# Patient Record
Sex: Male | Born: 1950 | Race: White | Hispanic: No | Marital: Married
Health system: Southern US, Community
[De-identification: ages and names within clinical notes are randomized; demographics above are authoritative.]

---

## 2020-08-11 ENCOUNTER — Encounter (HOSPITAL_BASED_OUTPATIENT_CLINIC_OR_DEPARTMENT_OTHER): Payer: Medicare HMO | Attending: Internal Medicine | Admitting: Physician Assistant

## 2020-08-11 ENCOUNTER — Other Ambulatory Visit: Payer: Self-pay

## 2020-08-11 DIAGNOSIS — I890 Lymphedema, not elsewhere classified: Secondary | ICD-10-CM | POA: Insufficient documentation

## 2020-08-11 DIAGNOSIS — L97512 Non-pressure chronic ulcer of other part of right foot with fat layer exposed: Secondary | ICD-10-CM | POA: Insufficient documentation

## 2020-08-11 DIAGNOSIS — E11621 Type 2 diabetes mellitus with foot ulcer: Secondary | ICD-10-CM | POA: Insufficient documentation

## 2020-08-11 DIAGNOSIS — I110 Hypertensive heart disease with heart failure: Secondary | ICD-10-CM | POA: Insufficient documentation

## 2020-08-11 DIAGNOSIS — E1140 Type 2 diabetes mellitus with diabetic neuropathy, unspecified: Secondary | ICD-10-CM | POA: Insufficient documentation

## 2020-08-11 DIAGNOSIS — I5042 Chronic combined systolic (congestive) and diastolic (congestive) heart failure: Secondary | ICD-10-CM | POA: Insufficient documentation

## 2020-08-12 NOTE — Progress Notes (Signed)
Ricky Lloyd, Ricky Lloyd ([id number]) Visit Report for 08/11/2020 Allergy List Details Patient Name: Date of Service: Ricky Lloyd, [name] 08/11/2020 2:45 PM Medical Record Number: [id number] Patient Account Number: [id number] Date of Birth/Sex: Treating RN: 06-05-1950 (69 y.o. Male) Zandra Abts Primary Care Sender Rueb: Cephus Richer Other Clinician: Referring Annajulia Lewing: Treating Nashea Chumney/Extender: Doristine Church Weeks in Treatment: 0 Allergies Active Allergies adhesive tape Reaction: hives hydrochlorothiazide Reaction: hives, arthralgias Statins-Hmg-Coa Reductase Inhibitors Reaction: nose bleeds, nausea/vomiting Allergy Notes Electronic Signature(s) Signed: 08/12/2020 5:54:24 PM By: Zandra Abts RN, BSN Entered By: Zandra Abts on 08/11/2020 15:18:45 -------------------------------------------------------------------------------- Arrival Information Details Patient Name: Date of Service: Ricky Lloyd, Ricky [name] 08/11/2020 2:45 PM Medical Record Number: [id number] Patient Account Number: [id number] Date of Birth/Sex: Treating RN: 08-14-1950 (69 y.o. Male) Zandra Abts Primary Care Fartun Paradiso: Cephus Richer Other Clinician: Referring Elianis Fischbach: Treating Reniyah Gootee/Extender: Earl Many in Treatment: 0 Visit Information Patient Arrived: Ambulatory Arrival Time: 15:13 Accompanied By: alone Transfer Assistance: None Patient Identification Verified: Yes Secondary Verification Process Completed: Yes Patient Requires Transmission-Based Precautions: No Patient Has Alerts: No Electronic Signature(s) Signed: 08/12/2020 5:54:24 PM By: Zandra Abts RN, BSN Entered By: Zandra Abts on 08/11/2020 15:13:41 -------------------------------------------------------------------------------- Clinic Level of Care Assessment Details Patient Name: Date of Service: Ricky Lloyd, Ricky [name] 08/11/2020 2:45 PM Medical Record Number: [id number] Patient  Account Number: [id number] Date of Birth/Sex: Treating RN: Sep 21, 1950 (69 y.o. Male) Zenaida Deed Primary Care Wynn Alldredge: Cephus Richer Other Clinician: Referring Deondray Ospina: Treating Loda Bialas/Extender: Earl Many in Treatment: 0 Clinic Level of Care Assessment Items TOOL 1 Quantity Score []  - 0 Use when EandM and Procedure is performed on INITIAL visit ASSESSMENTS - Nursing Assessment / Reassessment X- 1 20 General Physical Exam (combine w/ comprehensive assessment (listed just below) when performed on new pt. evals) X- 1 25 Comprehensive Assessment (HX, ROS, Risk Assessments, Wounds Hx, etc.) ASSESSMENTS - Wound and Skin Assessment / Reassessment []  - 0 Dermatologic / Skin Assessment (not related to wound area) ASSESSMENTS - Ostomy and/or Continence Assessment and Care []  - 0 Incontinence Assessment and Management []  - 0 Ostomy Care Assessment and Management (repouching, etc.) PROCESS - Coordination of Care X - Simple Patient / Family Education for ongoing care 1 15 []  - 0 Complex (extensive) Patient / Family Education for ongoing care X- 1 10 Staff obtains , Records, T Results / Process Orders est []  - 0 Staff telephones HHA, Nursing Homes / Clarify orders / etc []  - 0 Routine Transfer to another Facility (non-emergent condition) []  - 0 Routine Hospital Admission (non-emergent condition) X- 1 15 New Admissions / / Ordering NPWT Apligraf, etc. , []  - 0 Emergency Hospital Admission (emergent condition) PROCESS - Special Needs []  - 0 Pediatric / Minor Patient Management []  - 0 Isolation Patient Management []  - 0 Hearing / Language / Visual special needs []  - 0 Assessment of Community assistance (transportation, D/C planning, etc.) []  - 0 Additional assistance / Altered mentation []  - 0 Support Surface(s) Assessment (bed, cushion, seat, etc.) INTERVENTIONS - Miscellaneous []  - 0 External ear exam []   - 0 Patient Transfer (multiple staff / / Similar devices) []  - 0 Simple Staple / Suture removal (25 or less) []  - 0 Complex Staple / Suture removal (26 or more) []  - 0 Hypo/Hyperglycemic Management (do not check if billed separately) X- 1 15 Ankle / Brachial Index (ABI) - do not check if billed  separately Has the patient been seen at the hospital within the last three years: Yes Total Score: 100 Level Of Care: New/Established - Level 3 Electronic Signature(s) Signed: 08/12/2020 5:44:19 PM By: Zenaida Deed RN, BSN Signed: 08/12/2020 5:44:19 PM By: Zenaida Deed RN, BSN Entered By: Zenaida Deed on 08/11/2020 16:02:35 -------------------------------------------------------------------------------- Encounter Discharge Information Details Patient Name: Date of Service: Ricky Lloyd, Ricky [name] 08/11/2020 2:45 PM Medical Record Number: [id number] Patient Account Number: [id number] Date of Birth/Sex: Treating RN: Jun 08, 1950 (70 y.o. Male) Shawn Stall Primary Care Elizbeth Posa: Cephus Richer Other Clinician: Referring Kayne Yuhas: Treating Manda Holstad/Extender: Earl Many in Treatment: 0 Encounter Discharge Information Items Post Procedure Vitals Discharge Condition: Stable Temperature (F): 99 Ambulatory Status: Ambulatory Pulse (bpm): 83 Discharge Destination: Home Respiratory Rate (breaths/min): 18 Transportation: Private Auto Blood Pressure (mmHg): 174/96 Accompanied By: self Schedule Follow-up Appointment: Yes Clinical Summary of Care: Electronic Signature(s) Signed: 08/12/2020 5:53:43 PM By: Shawn Stall Entered By: Shawn Stall on 08/11/2020 17:06:10 -------------------------------------------------------------------------------- Lower Extremity Assessment Details Patient Name: Date of Service: Ricky Lloyd, Ricky [name] 08/11/2020 2:45 PM Medical Record Number: [id number] Patient Account Number: [id number] Date of Birth/Sex: Treating  RN: Sep 10, 1950 (69 y.o. Male) Zandra Abts Primary Care Azeneth Carbonell: Cephus Richer Other Clinician: Referring Shevy Yaney: Treating Sajan Cheatwood/Extender: Doristine Church Weeks in Treatment: 0 Edema Assessment Assessed: [Left: No] [Right: No] Edema: [Left: Ye] [Right: s] Calf Left: Right: Point of Measurement: 33 cm From Medial Instep 56.4 cm Ankle Left: Right: Point of Measurement: 16 cm From Medial Instep 32.5 cm Knee To Floor Left: Right: From Medial Instep 48 cm Vascular Assessment Pulses: Dorsalis Pedis Palpable: [Right:Yes] Blood Pressure: Brachial: Ankle: Electronic Signature(s) Signed: 08/12/2020 5:54:24 PM By: Zandra Abts RN, BSN Entered By: Zandra Abts on 08/11/2020 15:31:38 -------------------------------------------------------------------------------- Multi-Disciplinary Care Plan Details Patient Name: Date of Service: Ricky Lloyd, Ricky [name] 08/11/2020 2:45 PM Medical Record Number: [id number] Patient Account Number: [id number] Date of Birth/Sex: Treating RN: 05/18/1951 (69 y.o. Male) Zenaida Deed Primary Care Kenli Waldo: Cephus Richer Other Clinician: Referring Abbe Bula: Treating Carlean Crowl/Extender: Earl Many in Treatment: 0 Multidisciplinary Care Plan reviewed with physician Active Inactive Nutrition Nursing Diagnoses: Impaired glucose control: actual or potential Potential for alteratiion in Nutrition/Potential for imbalanced nutrition Goals: Patient/caregiver will maintain therapeutic glucose control Date Initiated: 08/11/2020 Target Resolution Date: 09/08/2020 Goal Status: Active Interventions: Assess HgA1c results as ordered upon admission and as needed Assess patient nutrition upon admission and as needed per policy Provide education on elevated blood sugars and impact on wound healing Treatment Activities: Patient referred to Primary Care Physician for further nutritional evaluation :  08/11/2020 Notes: Wound/Skin Impairment Nursing Diagnoses: Impaired tissue integrity Knowledge deficit related to ulceration/compromised skin integrity Goals: Patient/caregiver will verbalize understanding of skin care regimen Date Initiated: 08/11/2020 Target Resolution Date: 09/08/2020 Goal Status: Active Ulcer/skin breakdown will have a volume reduction of 30% by week 4 Date Initiated: 08/11/2020 Target Resolution Date: 09/08/2020 Goal Status: Active Interventions: Assess patient/caregiver ability to obtain necessary supplies Assess patient/caregiver ability to perform ulcer/skin care regimen upon admission and as needed Assess ulceration(s) every visit Provide education on ulcer and skin care Treatment Activities: Skin care regimen initiated : 08/11/2020 Topical wound management initiated : 08/11/2020 Notes: Electronic Signature(s) Signed: 08/12/2020 5:44:19 PM By: Zenaida Deed RN, BSN Entered By: Zenaida Deed on 08/11/2020 16:01:21 -------------------------------------------------------------------------------- Pain Assessment Details Patient Name: Date of Service: Ricky Lloyd, Ricky [name] 08/11/2020 2:45 PM Medical Record Number: [id number]  Patient Account Number: [id number] Date of Birth/Sex: Treating RN: 1950/12/23 72(69 y.o. Male) Zandra AbtsLynch, Shatara Primary Care Nielle Duford: Cephus Richerurner, Miranda Other Clinician: Referring Arlesia Kiel: Treating Keisy Strickler/Extender: Doristine ChurchStone III, Hoyt Geigler, Bryan Weeks in Treatment: 0 Active Problems Location of Pain Severity and Description of Pain Patient Has Paino No Site Locations Pain Management and Medication Current Pain Management: Electronic Signature(s) Signed: 08/12/2020 5:54:24 PM By: Zandra AbtsLynch, Shatara RN, BSN Entered By: Zandra AbtsLynch, Shatara on 08/11/2020 15:33:42 -------------------------------------------------------------------------------- Patient/Caregiver Education Details Patient Name: Date of Service: Ricky NNA Arlan OrganA, Ricky [name]  3/16/2022andnbsp2:45 PM Medical Record Number: [id number] Patient Account Number: [id number] Date of Birth/Gender: Treating RN: 1950/12/23 (69 y.o. Male) Zenaida DeedBoehlein, Linda Primary Care Physician: Cephus Richerurner, Miranda Other Clinician: Referring Physician: Treating Physician/Extender: Earl ManyStone III, Hoyt Geigler, Bryan Weeks in Treatment: 0 Education Assessment Education Provided To: Patient Education Topics Provided Elevated Blood Sugar/ Impact on Healing: Handouts: Elevated Blood Sugars: How Do They Affect Wound Healing Methods: Explain/Verbal, Printed Responses: Reinforcements needed, State content correctly Offloading: Handouts: What is Offloadingo Methods: Explain/Verbal, Printed Responses: Reinforcements needed, State content correctly Wound/Skin Impairment: Handouts: Caring for Your Ulcer, Skin Care Do's and Dont's Methods: Explain/Verbal, Printed Responses: Reinforcements needed, State content correctly Electronic Signature(s) Signed: 08/12/2020 5:44:19 PM By: Zenaida DeedBoehlein, Linda RN, BSN Entered By: Zenaida DeedBoehlein, Linda on 08/11/2020 16:02:05 -------------------------------------------------------------------------------- Wound Assessment Details Patient Name: Date of Service: Ricky Lloyd, Ricky [name] 08/11/2020 2:45 PM Medical Record Number: [id number] Patient Account Number: [id number] Date of Birth/Sex: Treating RN: 1950/12/23 43(69 y.o. Male) Zandra AbtsLynch, Shatara Primary Care Dolce Sylvia: Cephus Richerurner, Miranda Other Clinician: Referring Laterrian Hevener: Treating Savannah Morford/Extender: Doristine ChurchStone III, Hoyt Geigler, Bryan Weeks in Treatment: 0 Wound Status Wound Number: 1 Primary Diabetic Wound/Ulcer of the Lower Extremity Etiology: Wound Location: Right, Plantar T Great oe Wound Open Wounding Event: Trauma Status: Date Acquired: 12/27/2017 Comorbid Lymphedema, Sleep Apnea, Arrhythmia, Congestive Heart Failure, Weeks Of Treatment: 0 History: Coronary Artery Disease, Hypertension, Type II Diabetes, Clustered  Wound: No Osteoarthritis, Neuropathy Photos Wound Measurements Length: (cm) 0.5 Width: (cm) 0.5 Depth: (cm) 0.5 Area: (cm) 0.196 Volume: (cm) 0.098 Wound Description Classification: Grade 2 Wound Margin: Thickened Exudate Amount: Medium Exudate Type: Serosanguineous Exudate Color: red, brown Foul Odor After Cleansing: No Slough/Fibrino No % Reduction in Area: 0% % Reduction in Volume: 0% Epithelialization: None Tunneling: No Undermining: Yes Starting Position (o'clock): 12 Ending Position (o'clock): 12 Maximum Distance: (cm) 0.4 Wound Bed Granulation Amount: Large (67-100%) Exposed Structure Granulation Quality: Red, Pink Fascia Exposed: No Necrotic Amount: None Present (0%) Fat Layer (Subcutaneous Tissue) Exposed: Yes Tendon Exposed: No Muscle Exposed: No Joint Exposed: No Bone Exposed: No Treatment Notes Wound #1 (Toe Great) Wound Laterality: Plantar, Right Cleanser Peri-Wound Care Topical Primary Dressing KerraCel Ag Gelling Fiber Dressing, 2x2 in (silver alginate) Discharge Instruction: Apply silver alginate to wound bed as instructed Secondary Dressing Woven Gauze Sponges 2x2 in Discharge Instruction: Apply over primary dressing as directed. Optifoam Non-Adhesive Dressing, 4x4 in Discharge Instruction: Apply over primary dressing cut to form donut to help offload Secured With Conforming Stretch Gauze Bandage, Sterile 2x75 (in/in) Discharge Instruction: Secure with stretch gauze as directed. 33M Medipore H Soft Cloth Surgical T ape, 2x2 (in/yd) Discharge Instruction: Secure dressing with tape as directed. Compression Wrap Compression Stockings Add-Ons Electronic Signature(s) Signed: 08/12/2020 12:58:14 PM By: Karl Itoawkins, Destiny Signed: 08/12/2020 5:54:24 PM By: Zandra AbtsLynch, Shatara RN, BSN Entered By: Karl Itoawkins, Destiny on 08/11/2020 16:38:59 -------------------------------------------------------------------------------- Vitals Details Patient Name: Date of  Service: Ricky Lloyd, Ricky [name] 08/11/2020 2:45 PM Medical Record Number: [id number] Patient  Account Number: [id number] Date of Birth/Sex: Treating RN: 1950/12/20 (70 y.o. Male) Zandra Abts Primary Care Marilynne Dupuis: Cephus Richer Other Clinician: Referring Skarlette Lattner: Treating Tylena Prisk/Extender: Earl Many in Treatment: 0 Vital Signs Time Taken: 15:11 Temperature (F): 99.0 Height (in): 73 Pulse (bpm): 83 Source: Stated Respiratory Rate (breaths/min): 18 Source: Stated Respiratory Rate (breaths/min): 18 Weight (lbs): 390 Blood Pressure (mmHg): 174/96 Source: Stated Capillary Blood Glucose (mg/dl): 284 Body Mass Index (BMI): 51.4 Reference Range: 80 - 120 mg / dl Notes glucose per pt report Electronic Signature(s) Signed: 08/12/2020 5:54:24 PM By: Zandra Abts RN, BSN Entered By: Zandra Abts on 08/11/2020 15:42:04

## 2020-08-12 NOTE — Progress Notes (Signed)
TORRIAN, CANION ([id number]) Visit Report for 08/11/2020 Chief Complaint Document Details Patient Name: Date of Service: [name], [name] 08/11/2020 2:45 PM Medical Record Number: [id number] Patient Account Number: [id number] Date of Birth/Sex: Treating RN: 01/19/1951 (70 y.o. Male) Zenaida Deed Primary Care Provider: Cephus Richer Other Clinician: Referring Provider: Treating Provider/Extender: Earl Many in Treatment: 0 Information Obtained from: Patient Chief Complaint Right 1st T Ulcer oe Electronic Signature(s) Signed: 08/11/2020 3:56:22 PM By: Lenda Kelp PA-C Entered By: Lenda Kelp on 08/11/2020 15:56:22 -------------------------------------------------------------------------------- Debridement Details Patient Name: Date of Service: [name], [name] 08/11/2020 2:45 PM Medical Record Number: [id number] Patient Account Number: [id number] Date of Birth/Sex: Treating RN: [birth year] (70 y.o. Male) Zenaida Deed Primary Care Provider: Cephus Richer Other Clinician: Referring Provider: Treating Provider/Extender: Doristine Church Weeks in Treatment: 0 Debridement Performed for Assessment: Wound #1 Right,Plantar T Great oe Performed By: Physician Lenda Kelp, PA Debridement Type: Debridement Severity of Tissue Pre Debridement: Fat layer exposed Level of Consciousness (Pre-procedure): Awake and Alert Pre-procedure Verification/Time Out Yes - 16:00 Taken: Start Time: 16:00 Pain Control: Lidocaine 4% T opical Solution T Area Debrided (L x W): otal 0.8 (cm) x 0.8 (cm) = 0.64 (cm) Tissue and other material debrided: Viable, Non-Viable, Callus, Slough, Subcutaneous, Skin: Epidermis, Slough Level: Skin/Subcutaneous Tissue Debridement Description: Excisional Instrument: Curette Bleeding: Minimum Hemostasis Achieved: Pressure End Time: 16:07 Procedural Pain: 0 Post Procedural Pain: 0 Response to  Treatment: Procedure was tolerated well Level of Consciousness (Post- Awake and Alert procedure): Post Debridement Measurements of Total Wound Length: (cm) 0.8 Width: (cm) 0.8 Depth: (cm) 0.2 Volume: (cm) 0.101 Character of Wound/Ulcer Post Debridement: Improved Severity of Tissue Post Debridement: Fat layer exposed Post Procedure Diagnosis Same as Pre-procedure Electronic Signature(s) Signed: 08/11/2020 6:22:43 PM By: Lenda Kelp PA-C Signed: 08/12/2020 5:44:19 PM By: Zenaida Deed RN, BSN Entered By: Zenaida Deed on 08/11/2020 16:08:26 -------------------------------------------------------------------------------- HPI Details Patient Name: Date of Service: [name], [name] 08/11/2020 2:45 PM Medical Record Number: [id number] Patient Account Number: [id number] Date of Birth/Sex: Treating RN: Jun 03, 1950 (70 y.o. Male) Zenaida Deed Primary Care Provider: Cephus Richer Other Clinician: Referring Provider: Treating Provider/Extender: Earl Many in Treatment: 0 History of Present Illness HPI Description: 08/11/2020 upon evaluation today patient presents for initial inspection here in our clinic concerning issues he has been having with wounds over the plantar aspect of his great toe on the right which has been present he tells me since 2019. At that time he was cleaning his pool and scraped his toes on the bottom of the pool. 3 of the 4 toes healed without complication this wound however has never closed. He was previously seen at the wound care center in [place]. He tells me that he is used multiple medications included Hydrofera Blue, Aquacel, and unfortunately many other things none of which were ever completely effective. Has not been placed in a total contact cast in fact he did not even know what that was. He does tell me that Unna boots were used at one time as well as other compression wraps to help with leg ulcerations he does have  lymphedema but right now his legs seem to be doing quite well. His ABI is 1.02 today. He does have a history of an A1c 9.06 June 2020. Currently he has been utilizing Aquacel. He tells me he is out of that however. Again he  does have diabetes mellitus type 2, lymphedema, congestive heart failure, and coronary artery disease. Electronic Signature(s) Signed: 08/11/2020 6:03:22 PM By: Lenda Kelp PA-C Entered By: Lenda Kelp on 08/11/2020 18:03:22 -------------------------------------------------------------------------------- Physical Exam Details Patient Name: Date of Service: [name], [name] 08/11/2020 2:45 PM Medical Record Number: [id number] Patient Account Number: [id number] Date of Birth/Sex: Treating RN: 1950/06/19 (70 y.o. Male) Zenaida Deed Primary Care Provider: Cephus Richer Other Clinician: Referring Provider: Treating Provider/Extender: Earl Many in Treatment: 0 Constitutional patient is hypertensive.. pulse regular and within target range for patient.Marland Kitchen respirations regular, non-labored and within target range for patient.Marland Kitchen temperature within target range for patient.. Well-nourished and well-hydrated in no acute distress. Eyes conjunctiva clear no eyelid edema noted. pupils equal round and reactive to light and accommodation. Ears, Nose, Mouth, and Throat no gross abnormality of ear auricles or external auditory canals. normal hearing noted during conversation. mucus membranes moist. Respiratory normal breathing without difficulty. Cardiovascular 2+ dorsalis pedis/posterior tibialis pulses. Patient does have bilateral stage III lymphedema. Musculoskeletal normal gait and posture. no significant deformity or arthritic changes, no loss or range of motion, no clubbing. Psychiatric this patient is able to make decisions and demonstrates good insight into disease process. Alert and Oriented x 3. pleasant and  cooperative. Notes Upon inspection patient's wound bed actually showed signs of fairly good granulation at this time in the base of the wound. With that being said he had a significant amount of unfortunate buildup in regard to callus around the edges of the wound. This is going require sharp debridement to clear away and hopefully get things in a better position as far as healing is concerned. I did perform sharp debridement to clear this away and the patient tolerated all that today without complication. Electronic Signature(s) Signed: 08/11/2020 6:04:37 PM By: Lenda Kelp PA-C Entered By: Lenda Kelp on 08/11/2020 18:04:37 -------------------------------------------------------------------------------- Physician Orders Details Patient Name: Date of Service: [name], [name] 08/11/2020 2:45 PM Medical Record Number: [id number] Patient Account Number: [id number] Date of Birth/Sex: Treating RN: March 03, 1951 (70 y.o. 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 Bathing/ Shower/ Hygiene May shower and wash wound with soap and water. - on days when dressing is cahnged Edema Control - Lymphedema / SCD / Other Bilateral Lower Extremities Elevate legs to the level of the heart or above for 30 minutes daily  and/or when sitting, a frequency of: - whenever sitting Avoid standing for long periods of time. Patient to wear own compression stockings every day. Exercise regularly Moisturize legs daily. Off-Loading Wound #1 Right,Plantar T Great oe Open toe surgical shoe to: - right foot Wound Treatment Wound #1 - T Great oe Wound Laterality: Plantar, Right Prim Dressing: KerraCel Ag Gelling Fiber Dressing, 2x2 in (silver alginate) (DME) (Dispense As Written) Every Other Day/15 Days ary Discharge Instructions: Apply silver alginate to wound bed as instructed Secondary Dressing: Woven Gauze Sponges 2x2 in (  DME) (Generic) Every Other Day/15 Days Discharge Instructions: Apply over primary dressing as directed. Secondary Dressing: Optifoam Non-Adhesive Dressing, 4x4 in (DME) (Generic) Every Other Day/15 Days Discharge Instructions: Apply over primary dressing cut to form donut to help offload Secured With: Conforming Stretch Gauze Bandage, Sterile 2x75 (in/in) (DME) (Generic) Every Other Day/15 Days Discharge Instructions: Secure with stretch gauze as directed. Secured With: 15M Medipore H Soft Cloth Surgical Tape, 2x2 (in/yd) (DME) (Dispense As Written) Every Other Day/15 Days Discharge Instructions: Secure dressing with tape as directed. Radiology X-ray, foot right complete view - diabetic foot ulcer right great toe, r/o osteomyelitis CPT 72630 - (ICD10 L97.512 - Non-pressure chronic ulcer of other part of right foot with fat layer exposed) Electronic Signature(s) Signed: 08/11/2020 6:22:43 PM By: Lenda Kelp PA-C Signed: 08/12/2020 5:44:19 PM By: Zenaida Deed RN, BSN Entered By: Zenaida Deed on 08/11/2020 16:15:39 Prescription 08/11/2020 -------------------------------------------------------------------------------- Particia Jasper PA Patient Name: Provider: [birth year] [id number] Date of Birth: NPI#: Male [id number] Sex: DEA #: [id number] Phone #: License  #: Eligha Bridegroom [facility] Patient Address: [address] [place], [place] [address] [address] [place], [place] [address] [phone] Allergies adhesive tape; hydrochlorothiazide; Statins-Hmg-Coa Reductase Inhibitors Provider's Orders X-ray, foot right complete view - ICD10: L97.512 - diabetic foot ulcer right great toe, r/o osteomyelitis CPT 72630 Hand Signature: Date(s): Electronic Signature(s) Signed: 08/11/2020 6:22:43 PM By: Lenda Kelp PA-C Signed: 08/12/2020 5:44:19 PM By: Zenaida Deed RN, BSN Entered By: Zenaida Deed on 08/11/2020 16:15:39 -------------------------------------------------------------------------------- Problem List Details Patient Name: Date of Service: [name], [name] 08/11/2020 2:45 PM Medical Record Number: [id number] Patient Account Number: [id number] Date of Birth/Sex: Treating RN: 26-May-1951 (70 y.o. Male) Zenaida Deed Primary Care Provider: Cephus Richer Other Clinician: Referring Provider: Treating Provider/Extender: Doristine Church Weeks in Treatment: 0 Active Problems ICD-10 Encounter Code Description Active Date MDM Diagnosis E11.621 Type 2 diabetes mellitus with foot ulcer 08/11/2020 No Yes L97.512 Non-pressure chronic ulcer of other part of right foot with fat layer exposed 08/11/2020 No Yes E11.40 Type 2 diabetes mellitus with diabetic neuropathy, unspecified 08/11/2020 No Yes I89.0 Lymphedema, not elsewhere classified 08/11/2020 No Yes I50.42 Chronic combined systolic (congestive) and diastolic (congestive) heart failure 08/11/2020 No Yes I25.10 Atherosclerotic heart disease of native coronary artery without angina pectoris 08/11/2020 No Yes Inactive Problems Resolved Problems Electronic Signature(s) Signed: 08/11/2020 3:56:07 PM By: Lenda Kelp PA-C Entered By: Lenda Kelp on 08/11/2020  15:56:07 -------------------------------------------------------------------------------- Progress Note Details Patient Name: Date of Service: [name], [name] 08/11/2020 2:45 PM Medical Record Number: [id number] Patient Account Number: [id number] Date of Birth/Sex: Treating RN: 1950/10/10 (70 y.o. Male) Zenaida Deed Primary Care Provider: Cephus Richer Other Clinician: Referring Provider: Treating Provider/Extender: Doristine Church Weeks in Treatment: 0 Subjective Chief Complaint Information obtained from Patient Right 1st T Ulcer oe History of Present Illness (HPI) 08/11/2020 upon evaluation today patient presents for initial inspection here in our clinic concerning issues he has been having with wounds over the plantar aspect of his great toe on the right which has been present he tells me since 2019. At that time he was cleaning his pool and scraped his toes on the bottom of the pool. 3 of the 4 toes healed without complication this wound however has never closed. He was previously seen at the wound care center in [place]. He tells me that he is  used multiple medications included Hydrofera Blue, Aquacel, and unfortunately many other things none of which were ever completely effective. Has not been placed in a total contact cast in fact he did not even know what that was. He does tell me that Unna boots were used at one time as well as other compression wraps to help with leg ulcerations he does have lymphedema but right now his legs seem to be doing quite well. His ABI is 1.02 today. He does have a history of an A1c 9.06 June 2020. Currently he has been utilizing Aquacel. He tells me he is out of that however. Again he does have diabetes mellitus type 2, lymphedema, congestive heart failure, and coronary artery disease. Patient History Information obtained from Patient. Allergies adhesive tape (Reaction: hives), hydrochlorothiazide (Reaction: hives,  arthralgias), Statins-Hmg-Coa Reductase Inhibitors (Reaction: nose bleeds, nausea/vomiting) Family History Unknown History. Social History Never smoker, Marital Status - Married, Alcohol Use - Never, Drug Use - No History, Caffeine Use - Rarely. 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 Blood sugar is tested. Review of Systems (ROS) Constitutional Symptoms (General Health) Denies complaints or symptoms of Fatigue, Fever, Chills, Marked Weight Change. Eyes Complains or has symptoms of Glasses / Contacts - glasses. Ear/Nose/Mouth/Throat Denies complaints or symptoms of Chronic sinus problems or rhinitis. Gastrointestinal Denies complaints or symptoms of Frequent diarrhea, Nausea, Vomiting. Genitourinary Denies complaints or symptoms of Frequent urination. Integumentary (Skin) Complains or has symptoms of Wounds - wound on right great toe. Psychiatric Denies complaints or symptoms of Claustrophobia, Suicidal. Objective Constitutional patient is hypertensive.. pulse regular and within target range for patient.Marland Kitchen respirations regular, non-labored and within target range for patient.Marland Kitchen temperature within target range for patient.. Well-nourished and well-hydrated in no acute distress. Vitals Time Taken: 3:11 PM, Height: 73 in, Source: Stated, Weight: 390 lbs, Source: Stated, BMI: 51.4, Temperature: 99.0 F, Pulse: 83 bpm, Respiratory Rate: 18 breaths/min, Blood Pressure: 174/96 mmHg, Capillary Blood Glucose: 144 mg/dl. General Notes: glucose  per pt report Eyes conjunctiva clear no eyelid edema noted. pupils equal round and reactive to light and accommodation. Ears, Nose, Mouth, and Throat no gross abnormality of ear auricles or external auditory canals. normal hearing noted during conversation. mucus membranes moist. Respiratory normal breathing without difficulty. Cardiovascular 2+ dorsalis pedis/posterior tibialis pulses. Patient does have bilateral stage III lymphedema. Musculoskeletal normal gait and posture. no significant deformity or arthritic changes, no loss or range of motion, no clubbing. Psychiatric this patient is able to make decisions and demonstrates good insight into disease process. Alert and Oriented x 3. pleasant and cooperative. General Notes: Upon inspection patient's wound bed actually showed signs of fairly good granulation at this time in the base of the wound. With that being said he had a significant amount of unfortunate buildup in regard to callus around the edges of the wound. This is going require sharp debridement to clear away and hopefully get things in a better position as far as healing is concerned. I did perform sharp debridement to clear this away and the patient tolerated all that today without complication. Integumentary (Hair, Skin) Wound #1 status is Open. Original cause of wound  was Trauma. The date acquired was: 12/27/2017. The wound is located on the [employer],Plantar T Great. The oe wound measures 0.5cm length x 0.5cm width x 0.5cm depth; 0.196cm^2 area and 0.098cm^3 volume. There is Fat Layer (Subcutaneous Tissue) exposed. There is no tunneling noted, however, there is undermining starting at 12:00 and ending at 12:00 with a maximum distance of 0.4cm. There is a medium amount of serosanguineous drainage noted. The wound margin is thickened. There is large (67-100%) red, pink granulation within the wound bed. There is no necrotic tissue within the wound bed. Assessment Active  Problems ICD-10 Type 2 diabetes mellitus with foot ulcer Non-pressure chronic ulcer of other part of right foot with fat layer exposed Type 2 diabetes mellitus with diabetic neuropathy, unspecified Lymphedema, not elsewhere classified Chronic combined systolic (congestive) and diastolic (congestive) heart failure Atherosclerotic heart disease of native coronary artery without angina pectoris Procedures Wound #1 Pre-procedure diagnosis of Wound #1 is a Diabetic Wound/Ulcer of the Lower Extremity located on the Right,Plantar T Great .Severity of Tissue Pre oe Debridement is: Fat layer exposed. There was a Excisional Skin/Subcutaneous Tissue Debridement with a total area of 0.64 sq cm performed by Lenda KelpStone III, [place], PA. With the following instrument(s): Curette to remove Viable and Non-Viable tissue/material. Material removed includes Callus, Subcutaneous Tissue, Slough, and Skin: Epidermis after achieving pain control using Lidocaine 4% Topical Solution. No specimens were taken. A time out was conducted at 16:00, prior to the start of the procedure. A Minimum amount of bleeding was controlled with Pressure. The procedure was tolerated well with a pain level of 0 throughout and a pain level of 0 following the procedure. Post Debridement Measurements: 0.8cm length x 0.8cm width x 0.2cm depth; 0.101cm^3 volume. Character of Wound/Ulcer Post Debridement is improved. Severity of Tissue Post Debridement is: Fat layer exposed. Post procedure Diagnosis Wound #1: Same as Pre-Procedure Plan Follow-up Appointments: Return Appointment in 1 week. Bathing/ Shower/ Hygiene: May shower and wash wound with soap and water. - on days when dressing is cahnged Edema Control - Lymphedema / SCD / Other: Elevate legs to the level of the heart or above for 30 minutes daily and/or when sitting, a frequency of: - whenever sitting Avoid standing for long periods of time. Patient to wear own compression stockings every  day. Exercise regularly Moisturize legs daily. Off-Loading: Wound #1 Right,Plantar T Great: oe Open toe surgical shoe to: - right foot Radiology ordered were: X-ray, foot right complete view - diabetic foot ulcer right great toe, r/o osteomyelitis CPT [id number] WOUND #1: - T Great Wound Laterality: Plantar, Right oe Prim Dressing: KerraCel Ag Gelling Fiber Dressing, 2x2 in (silver alginate) (DME) (Dispense As Written) Every Other Day/15 Days ary Discharge Instructions: Apply silver alginate to wound bed as instructed Secondary Dressing: Woven Gauze Sponges 2x2 in (DME) (Generic) Every Other Day/15 Days Discharge Instructions: Apply over primary dressing as directed. Secondary Dressing: Optifoam Non-Adhesive Dressing, 4x4 in (DME) (Generic) Every Other Day/15 Days Discharge Instructions: Apply over primary dressing cut to form donut to help offload Secured With: Conforming Stretch Gauze Bandage, Sterile 2x75 (in/in) (DME) (Generic) Every Other Day/15 Days Discharge Instructions: Secure with stretch gauze as directed. Secured With: 18M Medipore H Soft Cloth Surgical T ape, 2x2 (in/yd) (DME) (Dispense As Written) Every Other Day/15 Days Discharge Instructions: Secure dressing with tape as directed. 1. Would recommend currently that we actually go ahead and initiate treatment with a silver alginate dressing which I think is actually a good option for him currently the  patient is in agreement with that plan. 2. I am also going to recommend that we have the patient continue with try and offload is much as possible given an offloading shoe today to try to help in that regard. 3. I am also can recommend currently that we have the patient go ahead and elevate his legs much as possible try to keep edema under good control. 4. I am also going to send in a prescription for an x-ray for him of the toe/foot in order to ensure there is no obvious signs of osteomyelitis. If I do not see anything here then we  may go ahead and proceed with a total contact cast to see if we get things moving in a better direction. I just want to be somewhat reassured that there is no evidence of obvious infection. We will see patient back for reevaluation in 1 week here in the clinic. If anything worsens or changes patient will contact our office for additional recommendations. Electronic Signature(s) Signed: 08/11/2020 6:05:44 PM By: Lenda Kelp PA-C Entered By: Lenda Kelp on 08/11/2020 18:05:44 -------------------------------------------------------------------------------- HxROS Details Patient Name: Date of Service: [name], [name] 08/11/2020 2:45 PM Medical Record Number: [id number] Patient Account Number: [id number] Date of Birth/Sex: Treating RN: March 14, 1951 (70 y.o. Male) Zandra Abts Primary Care Provider: Cephus Richer Other Clinician: Referring Provider: Treating Provider/Extender: Earl Many in Treatment: 0 Information Obtained From Patient Constitutional Symptoms (General Health) Complaints and Symptoms: Negative for: Fatigue; Fever; Chills; Marked Weight Change Eyes Complaints and Symptoms: Positive for: Glasses / Contacts - glasses Ear/Nose/Mouth/Throat Complaints and Symptoms: Negative for: Chronic sinus problems or rhinitis Gastrointestinal Complaints and Symptoms: Negative for: Frequent diarrhea; Nausea; Vomiting Genitourinary Complaints and Symptoms: Negative for: Frequent urination Integumentary (Skin) Complaints and Symptoms: Positive for: Wounds - wound on right great toe Psychiatric Complaints and Symptoms: Negative for: Claustrophobia; Suicidal Hematologic/Lymphatic Medical History: Positive for: Lymphedema - lower legs Respiratory Medical History: Positive for: Sleep Apnea - CPAP Cardiovascular Medical History: Positive for: Arrhythmia - A-fib; Congestive Heart Failure; Coronary Artery Disease - s/p CABG w/5 stents;  Hypertension Endocrine Medical History: Positive for: Type II Diabetes Time with diabetes: over 10 year Treated with: Insulin Blood sugar tested every day: Yes Tested : 2-3 times a day Immunological Musculoskeletal Medical History: Positive for: Osteoarthritis Negative for: Gout Neurologic Medical History: Positive for: Neuropathy Oncologic Immunizations Pneumococcal Vaccine: Received Pneumococcal Vaccination: Yes Implantable Devices None Family and Social History Unknown History: Yes; Never smoker; Marital Status - Married; Alcohol Use: Never; Drug Use: No History; Caffeine Use: Rarely; Financial Concerns: No; Food, Clothing or Shelter Needs: No; Support System Lacking: No; Transportation Concerns: No Electronic Signature(s) Signed: 08/11/2020 6:22:43 PM By: Lenda Kelp PA-C Signed: 08/12/2020 5:54:24 PM By: Zandra Abts RN, BSN Entered By: Zandra Abts on 08/11/2020 15:27:31 -------------------------------------------------------------------------------- SuperBill Details Patient Name: Date of Service: [name], [name] 08/11/2020 Medical Record Number: [id number] Patient Account Number: [id number] Date of Birth/Sex: Treating RN: [birth year] (70 y.o. 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 Facility Procedures CPT4 Code: 16109604 Description: WOUND CARE  VISIT-LEV 3 NEW PT Modifier: 25 Quantity: 1 CPT4 Code: [id number] Description: 11042 - DEB SUBQ TISSUE 20 SQ CM/< ICD-10 Diagnosis Description L97.512 Non-pressure chronic ulcer of other part of right foot with fat layer exposed E11.621 Type 2 diabetes mellitus with foot ulcer Modifier: Quantity: 1 Physician Procedures : CPT4 Code Description Modifier [id number] 99204 - WC PHYS LEVEL 4 - NEW PT 25 ICD-10 Diagnosis Description E11.621 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

## 2020-08-12 NOTE — Progress Notes (Signed)
Ricky Lloyd, Ricky Lloyd ([id number]) Visit Report for 08/11/2020 Abuse/Suicide Risk Screen Details Patient Name: Date of Service: [name], [name] 08/11/2020 2:45 PM Medical Record Number: [id number] Patient Account Number: [id number] Date of Birth/Sex: Treating RN: 04-04-1951 (69 y.o. Male) Zandra Abts Primary Care Airika Alkhatib: Cephus Richer Other Clinician: Referring Valory Wetherby: Treating Levy Cedano/Extender: Doristine Church Weeks in Treatment: 0 Abuse/Suicide Risk Screen Items Answer ABUSE RISK SCREEN: Has anyone close to you tried to hurt or harm you recentlyo No Do you feel uncomfortable with anyone in your familyo No Has anyone forced you do things that you didnt want to doo No Electronic Signature(s) Signed: 08/12/2020 5:54:24 PM By: Zandra Abts RN, BSN Entered By: Zandra Abts on 08/11/2020 15:28:03 -------------------------------------------------------------------------------- Activities of Daily Living Details Patient Name: Date of Service: [name], [name] 08/11/2020 2:45 PM Medical Record Number: [id number] Patient Account Number: [id number] Date of Birth/Sex: Treating RN: August 13, 1950 (69 y.o. Male) Zandra Abts Primary Care Anibal Quinby: Cephus Richer Other Clinician: Referring Kirstie Larsen: Treating Daquan Crapps/Extender: Doristine Church Weeks in Treatment: 0 Activities of Daily Living Items Answer Activities of Daily Living (Please select one for each item) Drive Automobile Completely Able T Medications ake Completely Able Use T elephone Completely Able Care for Appearance Completely Able Use T oilet Completely Able Bath / Shower Completely Able Dress Self Completely Able Feed Self Completely Able Walk Completely Able Get In / Out Bed Completely Able Housework Completely Able Prepare Meals Completely Able Handle Money Completely Able Shop for Self Completely Able Electronic Signature(s) Signed: 08/12/2020 5:54:24 PM By: Zandra Abts RN, BSN Entered By: Zandra Abts on 08/11/2020 15:28:18 -------------------------------------------------------------------------------- Education Screening Details Patient Name: Date of Service: [name], [name] 08/11/2020 2:45 PM Medical Record Number: [id number] Patient Account Number: [id number] Date of Birth/Sex: Treating RN: Oct 06, 1950 (69 y.o. Male) Zandra Abts Primary Care Rodrigo Mcgranahan: Cephus Richer Other Clinician: Referring Deward Sebek: Treating Kierrah Kilbride/Extender: Earl Many in Treatment: 0 Primary Learner Assessed: Patient Learning Preferences/Education Level/Primary Language Learning Preference: Explanation, Demonstration, Printed Material Highest Education Level: Grade School Preferred Language: English Cognitive Barrier Language Barrier: No Translator Needed: No Memory Deficit: No Emotional Barrier: No Cultural/Religious Beliefs Affecting Medical Care: No Physical Barrier Impaired Vision: No Impaired Hearing: No Decreased Hand dexterity: No Knowledge/Comprehension Knowledge Level: High Comprehension Level: High Ability to understand written instructions: High Ability to understand verbal instructions: High Motivation Anxiety Level: Calm Cooperation: Cooperative Education Importance: Acknowledges Need Interest in Health Problems: Asks Questions Perception: Coherent Willingness to Engage in Self-Management High Activities: Readiness to Engage in Self-Management High Activities: Electronic Signature(s) Signed: 08/12/2020 5:54:24 PM By: Zandra Abts RN, BSN Entered By: Zandra Abts on 08/11/2020 15:28:49 -------------------------------------------------------------------------------- Fall Risk Assessment Details Patient Name: Date of Service: [name], [name] 08/11/2020 2:45 PM Medical Record Number: [id number] Patient Account Number: [id number] Date of Birth/Sex: Treating RN: August 18, 1950 (69 y.o. Male)  Zandra Abts Primary Care Dacy Enrico: Cephus Richer Other Clinician: Referring Robbert Langlinais: Treating Earnest Mcgillis/Extender: Doristine Church Weeks in Treatment: 0 Fall Risk Assessment Items Have you had 2 or more falls in the last 12 monthso 0 No Have you had any fall that resulted in injury in the last 12 monthso 0 No FALLS RISK SCREEN History of falling - immediate or within 3 months 0 No Secondary diagnosis (Do you have 2 or more medical diagnoseso) 15 Yes Ambulatory aid None/bed rest/wheelchair/nurse 0 Yes Crutches/cane/walker 0 No Furniture 0 No Intravenous  therapy Access/Saline/Heparin Lock 0 No Gait/Transferring Normal/ bed rest/ wheelchair 0 Yes Weak (short steps with or without shuffle, stooped but able to lift head while walking, may seek 0 No support from furniture) Impaired (short steps with shuffle, may have difficulty arising from chair, head down, impaired 0 No balance) Mental Status Oriented to own ability 0 Yes Electronic Signature(s) Signed: 08/12/2020 5:54:24 PM By: Zandra Abts RN, BSN Entered By: Zandra Abts on 08/11/2020 15:29:25 -------------------------------------------------------------------------------- Foot Assessment Details Patient Name: Date of Service: [name], [name] 08/11/2020 2:45 PM Medical Record Number: [id number] Patient Account Number: [id number] Date of Birth/Sex: Treating RN: Jul 15, 1950 (70 y.o. Male) Zandra Abts Primary Care Lilian Fuhs: Cephus Richer Other Clinician: Referring Maisy Newport: Treating Angeles Paolucci/Extender: Doristine Church Weeks in Treatment: 0 Foot Assessment Items Site Locations + = Sensation present, - = Sensation absent, C = Callus, U = Ulcer R = Redness, W = Warmth, M = Maceration, PU = Pre-ulcerative lesion F = Fissure, S = Swelling, D = Dryness Assessment Right: Left: Other Deformity: No No Prior Foot Ulcer: No No Prior Amputation: No No Charcot Joint: No No Ambulatory  Status: Ambulatory Without Help Gait: Steady Electronic Signature(s) Signed: 08/12/2020 5:54:24 PM By: Zandra Abts RN, BSN Entered By: Zandra Abts on 08/11/2020 15:30:40 -------------------------------------------------------------------------------- Nutrition Risk Screening Details Patient Name: Date of Service: [name], [name] 08/11/2020 2:45 PM Medical Record Number: [id number] Patient Account Number: [id number] Date of Birth/Sex: Treating RN: 10-Aug-1950 (69 y.o. Male) Zandra Abts Primary Care Leighann Amadon: Cephus Richer Other Clinician: Referring Amaziah Ghosh: Treating Ottilie Wigglesworth/Extender: Doristine Church Weeks in Treatment: 0 Height (in): 73 Weight (lbs): 390 Body Mass Index (BMI): 51.4 Nutrition Risk Screening Items Score Screening NUTRITION RISK SCREEN: I have an illness or condition that made me change the kind and/or amount of food I eat 2 Yes I eat fewer than two meals per day 0 No I eat few fruits and vegetables, or milk products 0 No I have three or more drinks of beer, liquor or wine almost every day 0 No I have tooth or mouth problems that make it hard for me to eat 0 No I don't always have enough money to buy the food I need 0 No I eat alone most of the time 0 No I take three or more different prescribed or over-the-counter drugs a day 1 Yes Without wanting to, I have lost or gained 10 pounds in the last six months 0 No I am not always physically able to shop, cook and/or feed myself 2 Yes Nutrition Protocols Good Risk Protocol Moderate Risk Protocol 0 Provide education on nutrition High Risk Proctocol Risk Level: Moderate Risk Score: 5 Electronic Signature(s) Signed: 08/12/2020 5:54:24 PM By: Zandra Abts RN, BSN Entered By: Zandra Abts on 08/11/2020 15:29:33

## 2020-08-18 ENCOUNTER — Other Ambulatory Visit: Payer: Self-pay

## 2020-08-18 ENCOUNTER — Encounter (HOSPITAL_BASED_OUTPATIENT_CLINIC_OR_DEPARTMENT_OTHER): Payer: Medicare HMO | Admitting: Physician Assistant

## 2020-08-18 DIAGNOSIS — E11621 Type 2 diabetes mellitus with foot ulcer: Secondary | ICD-10-CM | POA: Diagnosis not present

## 2020-08-18 NOTE — Progress Notes (Addendum)
Ricky Lloyd, BATTERMAN ([id number]) Visit Report for 08/18/2020 Chief Complaint Document Details Patient Name: Date of Service: [name], [name] 08/18/2020 3:15 PM Medical Record Number: [id number] Patient Account Number: [id number] Date of Birth/Sex: Treating RN: 11-01-1950 (70 y.o. Ricky Lloyd Primary Care Provider: Cephus Lloyd Other Clinician: Referring Provider: Treating Provider/Extender: Ricky Lloyd in Treatment: 1 Information Obtained from: Patient Chief Complaint Right 1st T Ulcer oe Electronic Signature(s) Signed: 08/18/2020 3:49:48 PM By: Ricky Kelp PA-C Entered By: Ricky Lloyd on 08/18/2020 15:49:48 -------------------------------------------------------------------------------- Debridement Details Patient Name: Date of Service: [name], [name] 08/18/2020 3:15 PM Medical Record Number: [id number] Patient Account Number: [id number] Date of Birth/Sex: Treating RN: 11-Oct-1950 (70 y.o. Ricky Lloyd Primary Care Provider: Cephus Lloyd Other Clinician: Referring Provider: Treating Provider/Extender: Ricky Lloyd in Treatment: 1 Debridement Performed for Assessment: Wound #1 Right,Plantar T Great oe Performed By: Physician Ricky Kelp, PA Debridement Type: Debridement Severity of Tissue Pre Debridement: Fat layer exposed Level of Consciousness (Pre-procedure): Awake and Alert Pre-procedure Verification/Time Out Yes - 16:30 Taken: Start Time: 16:30 T Area Debrided (L x W): otal 0.8 (cm) x 0.8 (cm) = 0.64 (cm) Tissue and other material debrided: Viable, Non-Viable, Callus, Slough, Subcutaneous, Skin: Epidermis, Slough Level: Skin/Subcutaneous Tissue Debridement Description: Excisional Instrument: Curette Bleeding: Minimum Hemostasis Achieved: Pressure End Time: 16:33 Procedural Pain: 0 Post Procedural Pain: 0 Response to Treatment: Procedure was tolerated well Level of  Consciousness (Post- Awake and Alert procedure): Post Debridement Measurements of Total Wound Length: (cm) 0.3 Width: (cm) 0.4 Depth: (cm) 0.2 Volume: (cm) 0.019 Character of Wound/Ulcer Post Debridement: Improved Severity of Tissue Post Debridement: Fat layer exposed Post Procedure Diagnosis Same as Pre-procedure Electronic Signature(s) Signed: 08/18/2020 5:29:52 PM By: Ricky Kelp PA-C Signed: 08/18/2020 5:47:10 PM By: Ricky Deed RN, BSN Entered By: Ricky Lloyd on 08/18/2020 16:33:57 -------------------------------------------------------------------------------- HPI Details Patient Name: Date of Service: [name], [name] 08/18/2020 3:15 PM Medical Record Number: [id number] Patient Account Number: [id number] Date of Birth/Sex: Treating RN: 10/31/1950 (70 y.o. Ricky Lloyd Primary Care Provider: Cephus Lloyd Other Clinician: Referring Provider: Treating Provider/Extender: Ricky Lloyd in Treatment: 1 History of Present Illness HPI Description: 08/11/2020 upon evaluation today patient presents for initial inspection here in our clinic concerning issues he has been having with wounds over the plantar aspect of his great toe on the right which has been present he tells me since 2019. At that time he was cleaning his pool and scraped his toes on the bottom of the pool. 3 of the 4 toes healed without complication this wound however has never closed. He was previously seen at the wound care center in [place]. He tells me that he is used multiple medications included Hydrofera Blue, Aquacel, and unfortunately many other things none of which were ever completely effective. Has not been placed in a total contact cast in fact he did not even know what that was. He does tell me that Unna boots were used at one time as well as other compression wraps to help with leg ulcerations he does have lymphedema but right now his legs seem to be doing  quite well. His ABI is 1.02 today. He does have a history of an A1c 9.06 June 2020. Currently he has been utilizing Aquacel. He tells me he is out of that however. Again he does have diabetes mellitus type 2, lymphedema,  congestive heart failure, and coronary artery disease. 08/18/2020 upon evaluation today patient appears to be doing well with regard to his toe ulceration. Fortunately there is just a little bit of callus buildup here and I think that we can manage that quite nicely with appropriate dressing changes here. Fortunately there is no signs of active infection at this time. No fevers, chills, nausea, vomiting, or diarrhea. Unfortunately after I saw him last he ended up over the next 24 hours developing an issue with his right lower extremity further up around the knee and into the thigh region as well as the calf where he had a significant cellulitis completely unrelated to the toe. Nonetheless he did require antibiotics he was given Bactrim fortunately that seems to be doing significantly better which is great news he did show me the pictures. He also had an x-ray performed at his pain clinic and I did review that personally today it was actually an image that he brought me which was fairly clear I saw no evidence of obvious bony destruction he did have some obvious signs of arthritis but again nothing that I think is definitive for osteomyelitis at this point all this was discussed with the patient today as well. Electronic Signature(s) Signed: 08/18/2020 5:13:30 PM By: Ricky Kelp PA-C Entered By: Ricky Lloyd on 08/18/2020 17:13:30 -------------------------------------------------------------------------------- Physical Exam Details Patient Name: Date of Service: [name], [name] 08/18/2020 3:15 PM Medical Record Number: [id number] Patient Account Number: [id number] Date of Birth/Sex: Treating RN: June 14, 1950 (70 y.o. Ricky Lloyd Primary Care Provider: Cephus Lloyd Other Clinician: Referring Provider: Treating Provider/Extender: Ricky Lloyd, Ricky Lloyd in Treatment: 1 Constitutional Well-nourished and well-hydrated in no acute distress. Respiratory normal breathing without difficulty. Psychiatric this patient is able to make decisions and demonstrates good insight into disease process. Alert and Oriented x 3. pleasant and cooperative. Notes Upon inspection patient's wound bed actually showed signs of good granulation at this point with minimal slough buildup on the surface of the wound. I did perform sharp debridement however to clear away some of the necrotic debris on the surface of the wound as well as the callus around the edges of the wound he tolerated all that today without complication. Electronic Signature(s) Signed: 08/18/2020 5:17:47 PM By: Ricky Kelp PA-C Entered By: Ricky Lloyd on 08/18/2020 17:17:46 -------------------------------------------------------------------------------- Physician Orders Details Patient Name: Date of Service: [name], [name] 08/18/2020 3:15 PM Medical Record Number: [id number] Patient Account Number: [id number] Date of Birth/Sex: Treating RN: January 04, 1951 (70 y.o. Ricky Lloyd Primary Care Provider: Cephus Lloyd Other Clinician: Referring Provider: Treating Provider/Extender: Ricky Lloyd in Treatment: 1 Verbal / Phone Orders: No Diagnosis Coding ICD-10 Coding Code Description E11.621 Type 2 diabetes mellitus with foot ulcer L97.512 Non-pressure chronic ulcer of other part of right foot with fat layer exposed E11.40 Type 2 diabetes mellitus with diabetic neuropathy, unspecified I89.0 Lymphedema, not elsewhere classified I50.42 Chronic combined systolic (congestive) and diastolic (congestive) heart failure I25.10 Atherosclerotic heart disease of native coronary artery without angina pectoris Follow-up Appointments Return  Appointment in 1 week. Bathing/ Shower/ Hygiene May shower and wash wound with soap and water. - on days when dressing is cahnged Edema Control - Lymphedema / SCD / Other Bilateral Lower Extremities Elevate legs to the level of the heart or above for 30 minutes daily and/or when sitting, a frequency of: - whenever sitting Avoid standing for long periods of  time. Patient to wear own compression stockings every day. Exercise regularly Moisturize legs daily. Off-Loading Wound #1 Right,Plantar T Great oe Open toe surgical shoe to: - right foot Wound Treatment Wound #1 - T Great oe Wound Laterality: Plantar, Right Prim Dressing: KerraCel Ag Gelling Fiber Dressing, 2x2 in (silver alginate) (Dispense As Written) Every Other Day/15 Days ary Discharge Instructions: Apply silver alginate to wound bed as instructed Secondary Dressing: Woven Gauze Sponges 2x2 in (Generic) Every Other Day/15 Days Discharge Instructions: Apply over primary dressing as directed. Secondary Dressing: Optifoam Non-Adhesive Dressing, 4x4 in (Generic) Every Other Day/15 Days Discharge Instructions: Apply over primary dressing cut to form donut to help offload Secured With: Conforming Stretch Gauze Bandage, Sterile 2x75 (in/in) (Generic) Every Other Day/15 Days Discharge Instructions: Secure with stretch gauze as directed. Secured With: 54M Medipore H Soft Cloth Surgical Tape, 2x2 (in/yd) (Dispense As Written) Every Other Day/15 Days Discharge Instructions: Secure dressing with tape as directed. Electronic Signature(s) Signed: 08/18/2020 5:29:52 PM By: Ricky Kelp PA-C Signed: 08/18/2020 5:47:10 PM By: Ricky Deed RN, BSN Entered By: Ricky Lloyd on 08/18/2020 16:35:06 -------------------------------------------------------------------------------- Problem List Details Patient Name: Date of Service: [name], [name] 08/18/2020 3:15 PM Medical Record Number: [id number] Patient Account Number:  [id number] Date of Birth/Sex: Treating RN: 09/24/1950 (70 y.o. Ricky Lloyd Primary Care Provider: Cephus Lloyd Other Clinician: Referring Provider: Treating Provider/Extender: Ricky Lloyd in Treatment: 1 Active Problems ICD-10 Encounter Code Description Active Date MDM Diagnosis E11.621 Type 2 diabetes mellitus with foot ulcer 08/11/2020 No Yes L97.512 Non-pressure chronic ulcer of other part of right foot with fat layer exposed 08/11/2020 No Yes E11.40 Type 2 diabetes mellitus with diabetic neuropathy, unspecified 08/11/2020 No Yes I89.0 Lymphedema, not elsewhere classified 08/11/2020 No Yes I50.42 Chronic combined systolic (congestive) and diastolic (congestive) heart failure 08/11/2020 No Yes I25.10 Atherosclerotic heart disease of native coronary artery without angina pectoris 08/11/2020 No Yes Inactive Problems Resolved Problems Electronic Signature(s) Signed: 08/18/2020 3:49:42 PM By: Ricky Kelp PA-C Entered By: Ricky Lloyd on 08/18/2020 15:49:42 -------------------------------------------------------------------------------- Progress Note Details Patient Name: Date of Service: [name], [name] 08/18/2020 3:15 PM Medical Record Number: [id number] Patient Account Number: [id number] Date of Birth/Sex: Treating RN: 10/19/1950 (71 y.o. Ricky Lloyd Primary Care Provider: Other Clinician: Cephus Lloyd Referring Provider: Treating Provider/Extender: Ricky Lloyd in Treatment: 1 Subjective Chief Complaint Information obtained from Patient Right 1st T Ulcer oe History of Present Illness (HPI) 08/11/2020 upon evaluation today patient presents for initial inspection here in our clinic concerning issues he has been having with wounds over the plantar aspect of his great toe on the right which has been present he tells me since 2019. At that time he was cleaning his pool and scraped his toes on the  bottom of the pool. 3 of the 4 toes healed without complication this wound however has never closed. He was previously seen at the wound care center in [place]. He tells me that he is used multiple medications included Hydrofera Blue, Aquacel, and unfortunately many other things none of which were ever completely effective. Has not been placed in a total contact cast in fact he did not even know what that was. He does tell me that Unna boots were used at one time as well as other compression wraps to help with leg ulcerations he does have lymphedema but right now his legs seem to be doing quite well. His  ABI is 1.02 today. He does have a history of an A1c 9.06 June 2020. Currently he has been utilizing Aquacel. He tells me he is out of that however. Again he does have diabetes mellitus type 2, lymphedema, congestive heart failure, and coronary artery disease. 08/18/2020 upon evaluation today patient appears to be doing well with regard to his toe ulceration. Fortunately there is just a little bit of callus buildup here and I think that we can manage that quite nicely with appropriate dressing changes here. Fortunately there is no signs of active infection at this time. No fevers, chills, nausea, vomiting, or diarrhea. Unfortunately after I saw him last he ended up over the next 24 hours developing an issue with his right lower extremity further up around the knee and into the thigh region as well as the calf where he had a significant cellulitis completely unrelated to the toe. Nonetheless he did require antibiotics he was given Bactrim fortunately that seems to be doing significantly better which is great news he did show me the pictures. He also had an x-ray performed at his pain clinic and I did review that personally today it was actually an image that he brought me which was fairly clear I saw no evidence of obvious bony destruction he did have some obvious signs of arthritis but again nothing  that I think is definitive for osteomyelitis at this point all this was discussed with the patient today as well. Objective Constitutional Well-nourished and well-hydrated in no acute distress. Vitals Time Taken: 3:40 PM, Height: 73 in, Weight: 390 lbs, BMI: 51.4, Temperature: 97.9 F, Pulse: 125 bpm, Respiratory Rate: 22 breaths/min, Blood Pressure: 151/83 mmHg, Capillary Blood Glucose: 94 mg/dl. Respiratory normal breathing without difficulty. Psychiatric this patient is able to make decisions and demonstrates good insight into disease process. Alert and Oriented x 3. pleasant and cooperative. General Notes: Upon inspection patient's wound bed actually showed signs of good granulation at this point with minimal slough buildup on the surface of the wound. I did perform sharp debridement however to clear away some of the necrotic debris on the surface of the wound as well as the callus around the edges of the wound he tolerated all that today without complication. Integumentary (Hair, Skin) Wound #1 status is Open. Original cause of wound was Trauma. The date acquired was: 12/27/2017. The wound has been in treatment 1 Lloyd. The wound is located on the [employer]. The wound measures 0.3cm length x 0.4cm width x 0.2cm depth; 0.094cm^2 area and 0.019cm^3 volume. There is Fat oe Layer (Subcutaneous Tissue) exposed. There is no tunneling or undermining noted. There is a medium amount of serosanguineous drainage noted. The wound margin is thickened. There is large (67-100%) red, pink granulation within the wound bed. There is no necrotic tissue within the wound bed. General Notes: mild callous periwound. Assessment Active Problems ICD-10 Type 2 diabetes mellitus with foot ulcer Non-pressure chronic ulcer of other part of right foot with fat layer exposed Type 2 diabetes mellitus with diabetic neuropathy, unspecified Lymphedema, not elsewhere classified Chronic combined systolic  (congestive) and diastolic (congestive) heart failure Atherosclerotic heart disease of native coronary artery without angina pectoris Procedures Wound #1 Pre-procedure diagnosis of Wound #1 is a Diabetic Wound/Ulcer of the Lower Extremity located on the Right,Plantar T Great .Severity of Tissue Pre oe Debridement is: Fat layer exposed. There was a Excisional Skin/Subcutaneous Tissue Debridement with a total area of 0.64 sq cm performed by Ricky Kelp, PA. With  the following instrument(s): Curette to remove Viable and Non-Viable tissue/material. Material removed includes Callus, Subcutaneous Tissue, Slough, and Skin: Epidermis. No specimens were taken. A time out was conducted at 16:30, prior to the start of the procedure. A Minimum amount of bleeding was controlled with Pressure. The procedure was tolerated well with a pain level of 0 throughout and a pain level of 0 following the procedure. Post Debridement Measurements: 0.3cm length x 0.4cm width x 0.2cm depth; 0.019cm^3 volume. Character of Wound/Ulcer Post Debridement is improved. Severity of Tissue Post Debridement is: Fat layer exposed. Post procedure Diagnosis Wound #1: Same as Pre-Procedure Plan Follow-up Appointments: Return Appointment in 1 week. Bathing/ Shower/ Hygiene: May shower and wash wound with soap and water. - on days when dressing is cahnged Edema Control - Lymphedema / SCD / Other: Elevate legs to the level of the heart or above for 30 minutes daily and/or when sitting, a frequency of: - whenever sitting Avoid standing for long periods of time. Patient to wear own compression stockings every day. Exercise regularly Moisturize legs daily. Off-Loading: Wound #1 Right,Plantar T Great: oe Open toe surgical shoe to: - right foot WOUND #1: - T Great Wound Laterality: Plantar, Right oe Prim Dressing: KerraCel Ag Gelling Fiber Dressing, 2x2 in (silver alginate) (Dispense As Written) Every Other Day/15  Days ary Discharge Instructions: Apply silver alginate to wound bed as instructed Secondary Dressing: Woven Gauze Sponges 2x2 in (Generic) Every Other Day/15 Days Discharge Instructions: Apply over primary dressing as directed. Secondary Dressing: Optifoam Non-Adhesive Dressing, 4x4 in (Generic) Every Other Day/15 Days Discharge Instructions: Apply over primary dressing cut to form donut to help offload Secured With: Conforming Stretch Gauze Bandage, Sterile 2x75 (in/in) (Generic) Every Other Day/15 Days Discharge Instructions: Secure with stretch gauze as directed. Secured With: 16M Medipore H Soft Cloth Surgical T ape, 2x2 (in/yd) (Dispense As Written) Every Other Day/15 Days Discharge Instructions: Secure dressing with tape as directed. 1. Would recommend currently that we going continue with the wound care measures as before and the patient is in agreement with the plan. This includes the use of the silver alginate dressing which I think is still a good option for him at this point. 2. Muscle can recommend that we have the patient continue to offload is much as possible to try to keep pressure off of this area we also want avoid friction if at all possible. 3 I am also can recommend that he continue to is much as possible monitor for any signs of infection his wife performs dressing changes she is doing a good job based on what I see. 4. With regard to the x-ray I do not see any obvious signs of osteomyelitis. If anything changes visually with his wounds then I will make any adjustments as necessary for the necessary intervention. Right now however I do not think we need to proceed with an MRI. We will see patient back for reevaluation in 1 week here in the clinic. If anything worsens or changes patient will contact our office for additional recommendations. Electronic Signature(s) Signed: 08/18/2020 5:18:55 PM By: Ricky Kelp PA-C Entered By: Ricky Lloyd on 08/18/2020  17:18:54 -------------------------------------------------------------------------------- SuperBill Details Patient Name: Date of Service: [name], [name] 08/18/2020 Medical Record Number: [id number] Patient Account Number: [id number] Date of Birth/Sex: Treating RN: 05/12/1951 (70 y.o. Ricky Lloyd Primary Care Provider: Cephus Lloyd Other Clinician: Referring Provider: Treating Provider/Extender: Ricky Lloyd, Ricky Lloyd in Treatment: 1 Diagnosis Coding  ICD-10 Codes Code Description E11.621 Type 2 diabetes mellitus with foot ulcer L97.512 Non-pressure chronic ulcer of other part of right foot with fat layer exposed E11.40 Type 2 diabetes mellitus with diabetic neuropathy, unspecified I89.0 Lymphedema, not elsewhere classified I50.42 Chronic combined systolic (congestive) and diastolic (congestive) heart failure I25.10 Atherosclerotic heart disease of native coronary artery without angina pectoris Facility Procedures CPT4 Code: [id number] Description: 11042 - DEB SUBQ TISSUE 20 SQ CM/< ICD-10 Diagnosis Description L97.512 Non-pressure chronic ulcer of other part of right foot with fat layer exposed Modifier: Quantity: 1 Physician Procedures : CPT4 Code Description Modifier [id number] - WC PHYS LEVEL 4 - EST PT 25 ICD-10 Diagnosis Description E11.621 Type 2 diabetes mellitus with foot ulcer L97.512 Non-pressure chronic ulcer of other part of right foot with fat layer exposed E11.40 Type 2  diabetes mellitus with diabetic neuropathy, unspecified I89.0 Lymphedema, not elsewhere classified Quantity: 1 : [id number] 11042 - WC PHYS SUBQ TISS 20 SQ CM ICD-10 Diagnosis Description L97.512 Non-pressure chronic ulcer of other part of right foot with fat layer exposed Quantity: 1 Electronic Signature(s) Signed: 08/18/2020 5:19:19 PM By: Ricky KelpStone III, Moselle Rister PA-C Entered By: Ricky KelpStone III, Philopater Mucha on 08/18/2020 17:19:18

## 2020-08-19 NOTE — Progress Notes (Signed)
MCIHAEL, Ricky Lloyd ([id number]) Visit Report for 08/18/2020 Arrival Information Details Patient Name: Date of Service: [name], [name] 08/18/2020 3:15 PM Medical Record Number: [id number] Patient Account Number: [id number] Date of Birth/Sex: Treating RN: April 05, 1951 (70 y.o. Ricky Lloyd Primary Care Ricky Lloyd: Ricky Lloyd Other Clinician: Referring Ricky Lloyd: Treating Ricky Lloyd/Extender: Ricky Lloyd in Treatment: 1 Visit Information History Since Last Visit Added or deleted any medications: Yes Patient Arrived: Ambulatory Any new allergies or adverse reactions: No Arrival Time: 15:39 Had a fall or experienced change in No Accompanied By: self activities of daily living that may affect Transfer Assistance: None risk of falls: Patient Identification Verified: Yes Signs or symptoms of abuse/neglect since last visito No Secondary Verification Process Completed: Yes Hospitalized since last visit: No Patient Requires Transmission-Based Precautions: No Implantable device outside of the clinic excluding No Patient Has Alerts: No cellular tissue based products placed in the center since last visit: Has Dressing in Place as Prescribed: Yes Pain Present Now: Yes Notes Per patient went to PCP last Thursday related to edema, redness, and pain to right leg. Per patient PCP gave Bactrim DS x10 days. Per patient PCP noted it to cellulitis verses sepsis. Today right leg redness has decreased per patient. Electronic Signature(s) Signed: 08/18/2020 6:09:10 PM By: Ricky Lloyd Entered By: Ricky Lloyd on 08/18/2020 15:44:15 -------------------------------------------------------------------------------- Encounter Discharge Information Details Patient Name: Date of Service: [name], [name] 08/18/2020 3:15 PM Medical Record Number: [id number] Patient Account Number: [id number] Date of Birth/Sex: Treating RN: [birth year] (70 y.o. Ricky Lloyd,  Ricky Lloyd Primary Care Ricky Lloyd: Ricky Lloyd Other Clinician: Referring Ricky Lloyd: Treating Ricky Lloyd/Extender: Ricky Lloyd in Treatment: 1 Encounter Discharge Information Items Post Procedure Vitals Discharge Condition: Stable Temperature (F): 97.4 Ambulatory Status: Ambulatory Pulse (bpm): 74 Discharge Destination: Home Respiratory Rate (breaths/min): 17 Transportation: Private Auto Blood Pressure (mmHg): 134/74 Accompanied By: self Schedule Follow-up Appointment: Yes Clinical Summary of Care: Patient Declined Electronic Signature(s) Signed: 08/18/2020 5:50:40 PM By: Ricky Mu RN Entered By: Ricky Lloyd on 08/18/2020 17:32:08 -------------------------------------------------------------------------------- Lower Extremity Assessment Details Patient Name: Date of Service: [name], [name] 08/18/2020 3:15 PM Medical Record Number: [id number] Patient Account Number: [id number] Date of Birth/Sex: Treating RN: 1951/02/19 (70 y.o. Ricky Lloyd Primary Care Azani Brogdon: Ricky Lloyd Other Clinician: Referring Ricky Lloyd: Treating Ricky Lloyd/Extender: Ricky Lloyd, Ricky Lloyd in Treatment: 1 Edema Assessment Assessed: [Left: No] [Right: Yes] Edema: [Left: Ye] [Right: s] Calf Left: Right: Point of Measurement: 33 cm From Medial Instep 57.6 cm Ankle Left: Right: Point of Measurement: 16 cm From Medial Instep 23.3 cm Electronic Signature(s) Signed: 08/18/2020 6:09:10 PM By: Ricky Lloyd Entered By: Ricky Lloyd on 08/18/2020 15:46:23 -------------------------------------------------------------------------------- Multi-Disciplinary Care Plan Details Patient Name: Date of Service: [name], [name] 08/18/2020 3:15 PM Medical Record Number: [id number] Patient Account Number: [id number] Date of Birth/Sex: Treating RN: [birth year] (70 y.o. Ricky Lloyd Primary Care Roma Bierlein: Ricky Lloyd Other  Clinician: Referring Ricky Lloyd: Treating Ricky Lloyd/Extender: Ricky Lloyd in Treatment: 1 Multidisciplinary Care Plan reviewed with physician Active Inactive Nutrition Nursing Diagnoses: Impaired glucose control: actual or potential Potential for alteratiion in Nutrition/Potential for imbalanced nutrition Goals: Patient/caregiver will maintain therapeutic glucose control Date Initiated: 08/11/2020 Target Resolution Date: 09/08/2020 Goal Status: Active Interventions: Assess HgA1c results as ordered upon admission and as needed Assess patient nutrition upon admission and as needed per policy Provide education on elevated blood sugars and  impact on wound healing Treatment Activities: Patient referred to Primary Care Physician for further nutritional evaluation : 08/11/2020 Notes: Wound/Skin Impairment Nursing Diagnoses: Impaired tissue integrity Knowledge deficit related to ulceration/compromised skin integrity Goals: Patient/caregiver will verbalize understanding of skin care regimen Date Initiated: 08/11/2020 Target Resolution Date: 09/08/2020 Goal Status: Active Ulcer/skin breakdown will have a volume reduction of 30% by week 4 Date Initiated: 08/11/2020 Target Resolution Date: 09/08/2020 Goal Status: Active Interventions: Assess patient/caregiver ability to obtain necessary supplies Assess patient/caregiver ability to perform ulcer/skin care regimen upon admission and as needed Assess ulceration(s) every visit Provide education on ulcer and skin care Treatment Activities: Skin care regimen initiated : 08/11/2020 Topical wound management initiated : 08/11/2020 Notes: Electronic Signature(s) Signed: 08/18/2020 5:47:10 PM By: Ricky Deed RN, BSN Entered By: Ricky Lloyd on 08/18/2020 16:28:57 -------------------------------------------------------------------------------- Pain Assessment Details Patient Name: Date of Service: [name], HA RO LD  W. 08/18/2020 3:15 PM Medical Record Number: [id number] Patient Account Number: [id number] Date of Birth/Sex: Treating RN: [birth year] (70 y.o. Ricky Lloyd Primary Care Brnadon Eoff: Ricky Lloyd Other Clinician: Referring Djuna Frechette: Treating Sankalp Ferrell/Extender: Ricky Lloyd in Treatment: 1 Active Problems Location of Pain Severity and Description of Pain Patient Has Paino Yes Site Locations Pain Location: Generalized Pain Rate the pain. Current Pain Level: 6 Worst Pain Level: 10 Least Pain Level: 0 Tolerable Pain Level: 8 Pain Management and Medication Current Pain Management: Medication: No Cold Application: No Rest: No Massage: No Activity: No T.E.N.S.: No Heat Application: No Leg drop or elevation: No Is the Current Pain Management Adequate: Adequate How does your wound impact your activities of daily livingo Sleep: No Bathing: No Appetite: No Relationship With Others: No Bladder Continence: No Emotions: No Bowel Continence: No Work: No Toileting: No Drive: No Dressing: No Hobbies: No Electronic Signature(s) Signed: 08/18/2020 6:09:10 PM By: Ricky Lloyd Entered By: Ricky Lloyd on 08/18/2020 15:45:08 -------------------------------------------------------------------------------- Patient/Caregiver Education Details Patient Name: Date of Service: [name], [name] 3/23/2022andnbsp3:15 PM Medical Record Number: [id number] Patient Account Number: [id number] Date of Birth/Gender: Treating RN: 16-Nov-1950 (70 y.o. Ricky Lloyd Primary Care Physician: Ricky Lloyd Other Clinician: Referring Physician: Treating Physician/Extender: Ricky Lloyd in Treatment: 1 Education Assessment Education Provided To: Patient Education Topics Provided Elevated Blood Sugar/ Impact on Healing: Methods: Explain/Verbal Responses: Reinforcements needed, State content correctly Offloading: Methods:  Explain/Verbal Responses: Reinforcements needed, State content correctly Wound/Skin Impairment: Methods: Explain/Verbal Responses: Reinforcements needed, State content correctly Electronic Signature(s) Signed: 08/18/2020 5:47:10 PM By: Ricky Deed RN, BSN Entered By: Ricky Lloyd on 08/18/2020 84:69:62 -------------------------------------------------------------------------------- Wound Assessment Details Patient Name: Date of Service: [name], [name] 08/18/2020 3:15 PM Medical Record Number: [id number] Patient Account Number: [id number] Date of Birth/Sex: Treating RN: Oct 09, 1950 (70 y.o. Ricky Lloyd Primary Care Donita Newland: Ricky Lloyd Other Clinician: Referring Aryn Kops: Treating Devetta Hagenow/Extender: Ricky Lloyd, Ricky Lloyd in Treatment: 1 Wound Status Wound Number: 1 Primary Diabetic Wound/Ulcer of the Lower Extremity Etiology: Wound Location: Right, Plantar T Great oe Wound Open Wounding Event: Trauma Status: Date Acquired: 12/27/2017 Comorbid Lymphedema, Sleep Apnea, Arrhythmia, Congestive Heart Failure, Lloyd Of Treatment: 1 History: Coronary Artery Disease, Hypertension, Type II Diabetes, Clustered Wound: No Osteoarthritis, Neuropathy Photos Wound Measurements Length: (cm) 0.3 Width: (cm) 0.4 Depth: (cm) 0.2 Area: (cm) 0.094 Volume: (cm) 0.019 % Reduction in Area: 52% % Reduction in Volume: 80.6% Epithelialization: Medium (34-66%) Tunneling: No Undermining: No Wound Description Classification: Grade 2 Wound Margin: Thickened Exudate  Amount: Medium Exudate Type: Serosanguineous Exudate Color: red, brown Foul Odor After Cleansing: No Slough/Fibrino No Wound Bed Granulation Amount: Large (67-100%) Exposed Structure Granulation Quality: Red, Pink Fascia Exposed: No Necrotic Amount: None Present (0%) Fat Layer (Subcutaneous Tissue) Exposed: Yes Tendon Exposed: No Muscle Exposed: No Joint Exposed: No Bone Exposed:  No Assessment Notes mild callous periwound. Treatment Notes Wound #1 (Toe Great) Wound Laterality: Plantar, Right Cleanser Peri-Wound Care Topical Primary Dressing KerraCel Ag Gelling Fiber Dressing, 2x2 in (silver alginate) Discharge Instruction: Apply silver alginate to wound bed as instructed Secondary Dressing Woven Gauze Sponges 2x2 in Discharge Instruction: Apply over primary dressing as directed. Optifoam Non-Adhesive Dressing, 4x4 in Discharge Instruction: Apply over primary dressing cut to form donut to help offload Secured With Conforming Stretch Gauze Bandage, Sterile 2x75 (in/in) Discharge Instruction: Secure with stretch gauze as directed. 74M Medipore H Soft Cloth Surgical T ape, 2x2 (in/yd) Discharge Instruction: Secure dressing with tape as directed. Compression Wrap Compression Stockings Add-Ons Electronic Signature(s) Signed: 08/18/2020 6:09:10 PM By: Ricky Lloyd Signed: 08/19/2020 9:47:55 AM By: Karl Ito Entered By: Karl Ito on 08/18/2020 16:16:16 -------------------------------------------------------------------------------- Vitals Details Patient Name: Date of Service: [name], [name] 08/18/2020 3:15 PM Medical Record Number: [id number] Patient Account Number: [id number] Date of Birth/Sex: Treating RN: November 10, 1950 (70 y.o. Ricky Lloyd Primary Care [place] Cantero: Ricky Lloyd Other Clinician: Referring Dreya Buhrman: Treating Donovin Kraemer/Extender: Ricky Lloyd, Ricky Lloyd in Treatment: 1 Vital Signs Time Taken: 15:40 Temperature (F): 97.9 Height (in): 73 Pulse (bpm): 125 Weight (lbs): 390 Respiratory Rate (breaths/min): 22 Body Mass Index (BMI): 51.4 Blood Pressure (mmHg): 151/83 Capillary Blood Glucose (mg/dl): 94 Reference Range: 80 - 120 mg / dl Electronic Signature(s) Signed: 08/18/2020 6:09:10 PM By: Ricky Lloyd Entered By: Ricky Lloyd on 08/18/2020 15:44:35

## 2020-08-25 ENCOUNTER — Encounter (HOSPITAL_BASED_OUTPATIENT_CLINIC_OR_DEPARTMENT_OTHER): Payer: Medicare HMO | Admitting: Physician Assistant

## 2020-08-25 ENCOUNTER — Other Ambulatory Visit: Payer: Self-pay

## 2020-08-25 DIAGNOSIS — E11621 Type 2 diabetes mellitus with foot ulcer: Secondary | ICD-10-CM | POA: Diagnosis not present

## 2020-08-25 NOTE — Progress Notes (Addendum)
Ricky Lloyd, Ricky W. ([id number]) Visit Report for 08/25/2020 Chief Complaint Document Details Patient Name: Date of Service: [name] 08/25/2020 3:45 PM Medical Record Number: [id number] Patient Account Number: [id number] Date of Birth/Sex: Treating RN: 09-Jun-1950 (70 y.o. Ricky Lloyd) Boehlein, Linda Primary Care Provider: Cephus Richerurner, Miranda Other Clinician: Referring Provider: Treating Provider/Extender: Nicanor BakeStone III, Serenidy Waltz Turner, [name] in Treatment: 2 Information Obtained from: Patient Chief Complaint Right 1st T Ulcer oe Electronic Signature(s) Signed: 08/25/2020 4:01:23 PM By: Lenda KelpStone III, Berthel Bagnall PA-C Entered By: Lenda KelpStone III, Ethelyn Cerniglia on 08/25/2020 16:01:23 -------------------------------------------------------------------------------- Debridement Details Patient Name: Date of Service: [name], [name] 08/25/2020 3:45 PM Medical Record Number: [id number] Patient Account Number: [id number] Date of Birth/Sex: Treating RN: 09-Jun-1950 (70 y.o. Ricky Lloyd) Boehlein, Linda Primary Care Provider: Cephus Richerurner, Miranda Other Clinician: Referring Provider: Treating Provider/Extender: Nicanor BakeStone III, Ballard Budney Turner, [name] in Treatment: 2 Debridement Performed for Assessment: Wound #1 Right,Plantar T Great oe Performed By: Physician Lenda KelpStone III, Sanuel Ladnier, PA Debridement Type: Debridement Severity of Tissue Pre Debridement: Fat layer exposed Level of Consciousness (Pre-procedure): Awake and Alert Pre-procedure Verification/Time Out Yes - 17:00 Taken: Start Time: 17:01 Pain Control: Lidocaine 4% T opical Solution T Area Debrided (L x W): otal 0.5 (cm) x 0.5 (cm) = 0.25 (cm) Tissue and other material debrided: Viable, Non-Viable, Callus, Slough, Subcutaneous, Skin: Epidermis, Slough Level: Skin/Subcutaneous Tissue Debridement Description: Excisional Instrument: Curette Bleeding: Minimum Hemostasis Achieved: Pressure End Time: 17:05 Procedural Pain: 0 Post Procedural Pain: 0 Response to Treatment:  Procedure was tolerated well Level of Consciousness (Post- Awake and Alert procedure): Post Debridement Measurements of Total Wound Length: (cm) 0.5 Width: (cm) 0.5 Depth: (cm) 0.3 Volume: (cm) 0.059 Character of Wound/Ulcer Post Debridement: Improved Severity of Tissue Post Debridement: Fat layer exposed Post Procedure Diagnosis Same as Pre-procedure Electronic Signature(s) Signed: 08/25/2020 5:20:30 PM By: Lenda KelpStone III, Pete Schnitzer PA-C Signed: 08/25/2020 5:50:20 PM By: Zenaida DeedBoehlein, Linda RN, BSN Entered By: Zenaida DeedBoehlein, Linda on 08/25/2020 17:05:51 -------------------------------------------------------------------------------- HPI Details Patient Name: Date of Service: [name], [name] 08/25/2020 3:45 PM Medical Record Number: [id number] Patient Account Number: [id number] Date of Birth/Sex: Treating RN: 09-Jun-1950 (70 y.o. Ricky Lloyd) Boehlein, Linda Primary Care Provider: Cephus Richerurner, Miranda Other Clinician: Referring Provider: Treating Provider/Extender: Nicanor BakeStone III, Evangelene Vora Turner, [name] in Treatment: 2 History of Present Illness HPI Description: 08/11/2020 upon evaluation today patient presents for initial inspection here in our clinic concerning issues he has been having with wounds over the plantar aspect of his great toe on the right which has been present he tells me since 2019. At that time he was cleaning his pool and scraped his toes on the bottom of the pool. 3 of the 4 toes healed without complication this wound however has never closed. He was previously seen at the [place] care [place] in [place]. He tells me that he is used multiple medications included Hydrofera Blue, Aquacel, and unfortunately many other things none of which were ever completely effective. Has not been placed in a total contact cast in fact he did not even know what that was. He does tell me that Unna boots were used at one time as well as other compression wraps to help with leg ulcerations he does have lymphedema but  right now his legs seem to be doing quite well. His ABI is 1.02 today. He does have a history of an A1c 9.06 June 2020. Currently he has been utilizing Aquacel. He tells me he is out of that however. Again he  does have diabetes mellitus type 2, lymphedema, congestive heart failure, and coronary artery disease. 08/18/2020 upon evaluation today patient appears to be doing well with regard to his toe ulceration. Fortunately there is just a little bit of callus buildup here and I think that we can manage that quite nicely with appropriate dressing changes here. Fortunately there is no signs of active infection at this time. No fevers, chills, nausea, vomiting, or diarrhea. Unfortunately after I saw him last he ended up over the next 24 hours developing an issue with his right lower extremity further up around the knee and into the thigh region as well as the calf where he had a significant cellulitis completely unrelated to the toe. Nonetheless he did require antibiotics he was given Bactrim fortunately that seems to be doing significantly better which is great news he did show me the pictures. He also had an x-ray performed at his pain clinic and I did review that personally today it was actually an image that he brought me which was fairly clear I saw no evidence of obvious bony destruction he did have some obvious signs of arthritis but again nothing that I think is definitive for osteomyelitis at this point all this was discussed with the patient today as well. 08/25/2020 upon evaluation today patient's wound actually showed signs of fairly good granulation and epithelization at this point. He does have some callus around the edges of the wound and unfortunately he does have some slough on the surface of the wound as well. This is going require sharp debridement. There is no signs of infection. Electronic Signature(s) Signed: 08/25/2020 5:15:24 PM By: Lenda Kelp PA-C Entered By: Lenda Kelp  on 08/25/2020 17:15:24 -------------------------------------------------------------------------------- Physical Exam Details Patient Name: Date of Service: [name], [name] 08/25/2020 3:45 PM Medical Record Number: [id number] Patient Account Number: [id number] Date of Birth/Sex: Treating RN: January 29, 1951 (70 y.o. Ricky Schooner Primary Care Provider: Cephus Richer Other Clinician: Referring Provider: Treating Provider/Extender: Cindy Hazy, [name] in Treatment: 2 Constitutional Obese and well-hydrated in no acute distress. Respiratory normal breathing without difficulty. Psychiatric this patient is able to make decisions and demonstrates good insight into disease process. Alert and Oriented x 3. pleasant and cooperative. Notes Patient's wound bed showed signs of good granulation epithelization at this point. There does not appear to be any evidence of active infection which is great news and overall very pleased with where things stand today. I do believe he may be a good candidate for total contact cast at this point make sure that his leg is under good control and there is no signs of cellulitis before we proceed. Again he had a completely unrelated event of cellulitis of the right leg after I initially saw him for his toe. He is done with the antibiotics I just 1 make sure nothing reoccurs here. Electronic Signature(s) Signed: 08/25/2020 5:15:56 PM By: Lenda Kelp PA-C Entered By: Lenda Kelp on 08/25/2020 17:15:56 -------------------------------------------------------------------------------- Physician Orders Details Patient Name: Date of Service: [name], [name] 08/25/2020 3:45 PM Medical Record Number: [id number] Patient Account Number: [id number] Date of Birth/Sex: Treating RN: 06-03-1950 (70 y.o. Ricky Schooner Primary Care Provider: Cephus Richer Other Clinician: Referring Provider: Treating Provider/Extender: Nicanor Bake in Treatment: 2 Verbal / Phone Orders: No Diagnosis Coding ICD-10 Coding Code Description E11.621 Type 2 diabetes mellitus with foot ulcer L97.512 Non-pressure chronic ulcer of other part of  right foot with fat layer exposed E11.40 Type 2 diabetes mellitus with diabetic neuropathy, unspecified I89.0 Lymphedema, not elsewhere classified I50.42 Chronic combined systolic (congestive) and diastolic (congestive) heart failure I25.10 Atherosclerotic heart disease of native coronary artery without angina pectoris Follow-up Appointments Return Appointment in 1 week. Bathing/ Shower/ Hygiene May shower and wash wound with soap and water. - on days when dressing is cahnged Edema Control - Lymphedema / SCD / Other Bilateral Lower Extremities Elevate legs to the level of the heart or above for 30 minutes daily and/or when sitting, a frequency of: - whenever sitting Avoid standing for long periods of time. Patient to wear own compression stockings every day. Exercise regularly Moisturize legs daily. Off-Loading Wound #1 Right,Plantar T Great oe Open toe surgical shoe to: - right foot Wound Treatment Wound #1 - T Great oe Wound Laterality: Plantar, Right Prim Dressing: KerraCel Ag Gelling Fiber Dressing, 2x2 in (silver alginate) (Dispense As Written) Every Other Day/15 Days ary Discharge Instructions: Apply silver alginate to wound bed as instructed Secondary Dressing: Woven Gauze Sponges 2x2 in (Generic) Every Other Day/15 Days Discharge Instructions: Apply over primary dressing as directed. Secondary Dressing: Optifoam Non-Adhesive Dressing, 4x4 in (Generic) Every Other Day/15 Days Discharge Instructions: Apply over primary dressing cut to form donut to help offload Secured With: Conforming Stretch Gauze Bandage, Sterile 2x75 (in/in) (Generic) Every Other Day/15 Days Discharge Instructions: Secure with stretch gauze as directed. Secured With: 17M Medipore H Soft  Cloth Surgical Tape, 2x2 (in/yd) (Dispense As Written) Every Other Day/15 Days Discharge Instructions: Secure dressing with tape as directed. Electronic Signature(s) Signed: 08/25/2020 5:20:30 PM By: Lenda Kelp PA-C Signed: 08/25/2020 5:50:20 PM By: Zenaida Deed RN, BSN Entered By: Zenaida Deed on 08/25/2020 17:06:55 -------------------------------------------------------------------------------- Problem List Details Patient Name: Date of Service: [name], [name] 08/25/2020 3:45 PM Medical Record Number: [id number] Patient Account Number: [id number] Date of Birth/Sex: Treating RN: 1951-05-07 (69 y.o. Ricky Schooner Primary Care Provider: Cephus Richer Other Clinician: Referring Provider: Treating Provider/Extender: Nicanor Bake in Treatment: 2 Active Problems ICD-10 Encounter Code Description Active Date MDM Diagnosis E11.621 Type 2 diabetes mellitus with foot ulcer 08/11/2020 No Yes L97.512 Non-pressure chronic ulcer of other part of right foot with fat layer exposed 08/11/2020 No Yes E11.40 Type 2 diabetes mellitus with diabetic neuropathy, unspecified 08/11/2020 No Yes I89.0 Lymphedema, not elsewhere classified 08/11/2020 No Yes I50.42 Chronic combined systolic (congestive) and diastolic (congestive) heart failure 08/11/2020 No Yes I25.10 Atherosclerotic heart disease of native coronary artery without angina pectoris 08/11/2020 No Yes Inactive Problems Resolved Problems Electronic Signature(s) Signed: 08/25/2020 4:01:18 PM By: Lenda Kelp PA-C Entered By: Lenda Kelp on 08/25/2020 16:01:17 -------------------------------------------------------------------------------- Progress Note Details Patient Name: Date of Service: [name], [name] 08/25/2020 3:45 PM Medical Record Number: [id number] Patient Account Number: [id number] Date of Birth/Sex: Treating RN: Feb 08, 1951 (70 y.o. Ricky Schooner Primary Care Provider:  Cephus Richer Other Clinician: Referring Provider: Treating Provider/Extender: Nicanor Bake in Treatment: 2 Subjective Chief Complaint Information obtained from Patient Right 1st T Ulcer oe History of Present Illness (HPI) 08/11/2020 upon evaluation today patient presents for initial inspection here in our clinic concerning issues he has been having with wounds over the plantar aspect of his great toe on the right which has been present he tells me since 2019. At that time he was cleaning his pool and scraped his toes on the bottom of the pool. 3  of the 4 toes healed without complication this wound however has never closed. He was previously seen at the wound care center in [place]. He tells me that he is used multiple medications included Hydrofera Blue, Aquacel, and unfortunately many other things none of which were ever completely effective. Has not been placed in a total contact cast in fact he did not even know what that was. He does tell me that Unna boots were used at one time as well as other compression wraps to help with leg ulcerations he does have lymphedema but right now his legs seem to be doing quite well. His ABI is 1.02 today. He does have a history of an A1c 9.06 June 2020. Currently he has been utilizing Aquacel. He tells me he is out of that however. Again he does have diabetes mellitus type 2, lymphedema, congestive heart failure, and coronary artery disease. 08/18/2020 upon evaluation today patient appears to be doing well with regard to his toe ulceration. Fortunately there is just a little bit of callus buildup here and I think that we can manage that quite nicely with appropriate dressing changes here. Fortunately there is no signs of active infection at this time. No fevers, chills, nausea, vomiting, or diarrhea. Unfortunately after I saw him last he ended up over the next 24 hours developing an issue with his right lower extremity further  up around the knee and into the thigh region as well as the calf where he had a significant cellulitis completely unrelated to the toe. Nonetheless he did require antibiotics he was given Bactrim fortunately that seems to be doing significantly better which is great news he did show me the pictures. He also had an x-ray performed at his pain clinic and I did review that personally today it was actually an image that he brought me which was fairly clear I saw no evidence of obvious bony destruction he did have some obvious signs of arthritis but again nothing that I think is definitive for osteomyelitis at this point all this was discussed with the patient today as well. 08/25/2020 upon evaluation today patient's wound actually showed signs of fairly good granulation and epithelization at this point. He does have some callus around the edges of the wound and unfortunately he does have some slough on the surface of the wound as well. This is going require sharp debridement. There is no signs of infection. Objective Constitutional Obese and well-hydrated in no acute distress. Vitals Time Taken: 4:31 PM, Height: 73 in, Weight: 390 lbs, BMI: 51.4, Temperature: 97.6 F, Pulse: 123 bpm, Respiratory Rate: 20 breaths/min, Blood Pressure: 107/77 mmHg, Capillary Blood Glucose: 147 mg/dl. General Notes: glucose per pt report Respiratory normal breathing without difficulty. Psychiatric this patient is able to make decisions and demonstrates good insight into disease process. Alert and Oriented x 3. pleasant and cooperative. General Notes: Patient's wound bed showed signs of good granulation epithelization at this point. There does not appear to be any evidence of active infection which is great news and overall very pleased with where things stand today. I do believe he may be a good candidate for total contact cast at this point make sure that his leg is under good control and there is no signs of cellulitis  before we proceed. Again he had a completely unrelated event of cellulitis of the right leg after I initially saw him for his toe. He is done with the antibiotics I just 1 make sure nothing reoccurs here. Integumentary (Hair,  Skin) Wound #1 status is Open. Original cause of wound was Trauma. The date acquired was: 12/27/2017. The wound has been in treatment 2 weeks. The wound is located on the [employer]. The wound measures 0.4cm length x 0.4cm width x 0.2cm depth; 0.126cm^2 area and 0.025cm^3 volume. There is Fat oe Layer (Subcutaneous Tissue) exposed. There is no tunneling noted, however, there is undermining starting at 12:00 and ending at 12:00 with a maximum distance of 0.2cm. There is a medium amount of serosanguineous drainage noted. The wound margin is thickened. There is large (67-100%) red, pink granulation within the wound bed. There is no necrotic tissue within the wound bed. Assessment Active Problems ICD-10 Type 2 diabetes mellitus with foot ulcer Non-pressure chronic ulcer of other part of right foot with fat layer exposed Type 2 diabetes mellitus with diabetic neuropathy, unspecified Lymphedema, not elsewhere classified Chronic combined systolic (congestive) and diastolic (congestive) heart failure Atherosclerotic heart disease of native coronary artery without angina pectoris Procedures Wound #1 Pre-procedure diagnosis of Wound #1 is a Diabetic Wound/Ulcer of the Lower Extremity located on the Right,Plantar T Great .Severity of Tissue Pre oe Debridement is: Fat layer exposed. There was a Excisional Skin/Subcutaneous Tissue Debridement with a total area of 0.25 sq cm performed by Lenda Kelp, PA. With the following instrument(s): Curette to remove Viable and Non-Viable tissue/material. Material removed includes Callus, Subcutaneous Tissue, Slough, and Skin: Epidermis after achieving pain control using Lidocaine 4% Topical Solution. No specimens were taken. A time  out was conducted at 17:00, prior to the start of the procedure. A Minimum amount of bleeding was controlled with Pressure. The procedure was tolerated well with a pain level of 0 throughout and a pain level of 0 following the procedure. Post Debridement Measurements: 0.5cm length x 0.5cm width x 0.3cm depth; 0.059cm^3 volume. Character of Wound/Ulcer Post Debridement is improved. Severity of Tissue Post Debridement is: Fat layer exposed. Post procedure Diagnosis Wound #1: Same as Pre-Procedure Plan Follow-up Appointments: Return Appointment in 1 week. Bathing/ Shower/ Hygiene: May shower and wash wound with soap and water. - on days when dressing is cahnged Edema Control - Lymphedema / SCD / Other: Elevate legs to the level of the heart or above for 30 minutes daily and/or when sitting, a frequency of: - whenever sitting Avoid standing for long periods of time. Patient to wear own compression stockings every day. Exercise regularly Moisturize legs daily. Off-Loading: Wound #1 Right,Plantar T Great: oe Open toe surgical shoe to: - right foot WOUND #1: - T Great Wound Laterality: Plantar, Right oe Prim Dressing: KerraCel Ag Gelling Fiber Dressing, 2x2 in (silver alginate) (Dispense As Written) Every Other Day/15 Days ary Discharge Instructions: Apply silver alginate to wound bed as instructed Secondary Dressing: Woven Gauze Sponges 2x2 in (Generic) Every Other Day/15 Days Discharge Instructions: Apply over primary dressing as directed. Secondary Dressing: Optifoam Non-Adhesive Dressing, 4x4 in (Generic) Every Other Day/15 Days Discharge Instructions: Apply over primary dressing cut to form donut to help offload Secured With: Conforming Stretch Gauze Bandage, Sterile 2x75 (in/in) (Generic) Every Other Day/15 Days Discharge Instructions: Secure with stretch gauze as directed. Secured With: 98M Medipore H Soft Cloth Surgical T ape, 2x2 (in/yd) (Dispense As Written) Every Other Day/15  Days Discharge Instructions: Secure dressing with tape as directed. 1. Would recommend currently that we going to continue with wound care measures as before and the patient is in agreement with the plan this includes the use of the silver alginate dressing which I  think is still the best option. 2. I am also can recommend that we continue with the foam dressing to cover to try to offload. 3. I am also can recommend that we use roll gauze to secure in place along with tape. We will see patient back for reevaluation in 1 week here in the clinic. If anything worsens or changes patient will contact our office for additional recommendations. We will consider the total contact cast discussion next week if he is indeed healed and no signs of cellulitis we will plan for the following week. Electronic Signature(s) Signed: 08/25/2020 5:16:39 PM By: Lenda Kelp PA-C Entered By: Lenda Kelp on 08/25/2020 17:16:39 -------------------------------------------------------------------------------- SuperBill Details Patient Name: Date of Service: [name], [name] 08/25/2020 Medical Record Number: [id number] Patient Account Number: [id number] Date of Birth/Sex: Treating RN: 09/12/1950 (70 y.o. Ricky Schooner Primary Care Provider: Cephus Richer Other Clinician: Referring Provider: Treating Provider/Extender: Nicanor Bake in Treatment: 2 Diagnosis Coding ICD-10 Codes Code Description [phone] Type 2 diabetes mellitus with foot ulcer L97.512 Non-pressure chronic ulcer of other part of right foot with fat layer exposed E11.40 Type 2 diabetes mellitus with diabetic neuropathy, unspecified I89.0 Lymphedema, not elsewhere classified I50.42 Chronic combined systolic (congestive) and diastolic (congestive) heart failure I25.10 Atherosclerotic heart disease of native coronary artery without angina pectoris Facility Procedures CPT4 Code: [id number] Description: 11042  - DEB SUBQ TISSUE 20 SQ CM/< ICD-10 Diagnosis Description L97.512 Non-pressure chronic ulcer of other part of right foot with fat layer exposed Modifier: Quantity: 1 Physician Procedures : CPT4 Code Description Modifier [id number] 11042 - WC PHYS SUBQ TISS 20 SQ CM ICD-10 Diagnosis Description L97.512 Non-pressure chronic ulcer of other part of right foot with fat layer exposed Quantity: 1 Electronic Signature(s) Signed: 08/25/2020 5:16:46 PM By: Lenda Kelp PA-C Entered By: Lenda Kelp on 08/25/2020 17:16:46

## 2020-08-26 ENCOUNTER — Encounter (HOSPITAL_BASED_OUTPATIENT_CLINIC_OR_DEPARTMENT_OTHER): Payer: Medicare HMO | Admitting: Internal Medicine

## 2020-08-27 NOTE — Progress Notes (Signed)
Ricky Lloyd, Ricky Lloyd ([id number]) Visit Report for 08/25/2020 Arrival Information Details Patient Name: Date of Service: [name], [name] 08/25/2020 3:45 PM Medical Record Number: [id number] Patient Account Number: [id number] Date of Birth/Sex: Treating RN: 07-02-1950 (70 y.o. Ricky Lloyd Primary Care Azavion Bouillon: Cephus Richer Other Clinician: Referring Clatie Kessen: Treating Princeston Blizzard/Extender: Nicanor Bake in Treatment: 2 Visit Information History Since Last Visit Added or deleted any medications: No Patient Arrived: Ambulatory Any new allergies or adverse reactions: No Arrival Time: 16:31 Had a fall or experienced change in No Accompanied By: alone activities of daily living that may affect Transfer Assistance: None risk of falls: Patient Identification Verified: Yes Signs or symptoms of abuse/neglect since last visito No Secondary Verification Process Completed: Yes Hospitalized since last visit: No Patient Requires Transmission-Based Precautions: No Implantable device outside of the clinic excluding No Patient Has Alerts: No cellular tissue based products placed in the center since last visit: Has Dressing in Place as Prescribed: Yes Pain Present Now: No Electronic Signature(s) Signed: 08/26/2020 5:46:28 PM By: Zandra Abts RN, BSN Entered By: Zandra Abts on 08/25/2020 16:31:55 -------------------------------------------------------------------------------- Encounter Discharge Information Details Patient Name: Date of Service: [name], [name] 08/25/2020 3:45 PM Medical Record Number: [id number] Patient Account Number: [id number] Date of Birth/Sex: Treating RN: Apr 18, 1951 (69 y.o. Ricky Lloyd, Lauren Primary Care Carren Blakley: Cephus Richer Other Clinician: Referring Collis Thede: Treating Alyne Martinson/Extender: Nicanor Bake in Treatment: 2 Encounter Discharge Information Items Post Procedure Vitals Discharge  Condition: Stable Temperature (F): 97.6 Ambulatory Status: Ambulatory Pulse (bpm): 107 Discharge Destination: Home Respiratory Rate (breaths/min): 17 Transportation: Private Auto Blood Pressure (mmHg): 110/70 Accompanied By: self Schedule Follow-up Appointment: Yes Clinical Summary of Care: Patient Declined Electronic Signature(s) Signed: 08/27/2020 5:01:26 PM By: Fonnie Mu RN Entered By: Fonnie Mu on 08/25/2020 17:45:36 -------------------------------------------------------------------------------- Lower Extremity Assessment Details Patient Name: Date of Service: [name], [name] 08/25/2020 3:45 PM Medical Record Number: [id number] Patient Account Number: [id number] Date of Birth/Sex: Treating RN: Oct 18, 1950 (70 y.o. Ricky Lloyd Primary Care Latosha Gaylord: Cephus Richer Other Clinician: Referring Aniello Christopoulos: Treating Ceci Taliaferro/Extender: Cindy Hazy, [name] in Treatment: 2 Edema Assessment Assessed: [Left: No] [Right: No] Edema: [Left: Ye] [Right: s] Calf Left: Right: Point of Measurement: 33 cm From Medial Instep 52 cm Ankle Left: Right: Point of Measurement: 16 cm From Medial Instep 30.5 cm Vascular Assessment Pulses: Dorsalis Pedis Palpable: [Right:Yes] Electronic Signature(s) Signed: 08/26/2020 5:46:28 PM By: Zandra Abts RN, BSN Entered By: Zandra Abts on 08/25/2020 16:36:57 -------------------------------------------------------------------------------- Multi-Disciplinary Care Plan Details Patient Name: Date of Service: [name], [name] 08/25/2020 3:45 PM Medical Record Number: [id number] Patient Account Number: [id number] Date of Birth/Sex: Treating RN: [birth year] (69 y.o. Ricky Lloyd Primary Care Kitty Cadavid: Cephus Richer Other Clinician: Referring Esperanza Madrazo: Treating Aviel Davalos/Extender: Nicanor Bake in Treatment: 2 Multidisciplinary Care Plan reviewed with physician Active  Inactive Nutrition Nursing Diagnoses: Impaired glucose control: actual or potential Potential for alteratiion in Nutrition/Potential for imbalanced nutrition Goals: Patient/caregiver will maintain therapeutic glucose control Date Initiated: 08/11/2020 Target Resolution Date: 09/08/2020 Goal Status: Active Interventions: Assess HgA1c results as ordered upon admission and as needed Assess patient nutrition upon admission and as needed per policy Provide education on elevated blood sugars and impact on wound healing Treatment Activities: Patient referred to Primary Care Physician for further nutritional evaluation : 08/11/2020 Notes: Wound/Skin Impairment Nursing Diagnoses: Impaired tissue integrity Knowledge deficit related to ulceration/compromised  skin integrity Goals: Patient/caregiver will verbalize understanding of skin care regimen Date Initiated: 08/11/2020 Target Resolution Date: 09/08/2020 Goal Status: Active Ulcer/skin breakdown will have a volume reduction of 30% by week 4 Date Initiated: 08/11/2020 Target Resolution Date: 09/08/2020 Goal Status: Active Interventions: Assess patient/caregiver ability to obtain necessary supplies Assess patient/caregiver ability to perform ulcer/skin care regimen upon admission and as needed Assess ulceration(s) every visit Provide education on ulcer and skin care Treatment Activities: Skin care regimen initiated : 08/11/2020 Topical wound management initiated : 08/11/2020 Notes: Electronic Signature(s) Signed: 08/25/2020 5:50:20 PM By: Zenaida Deed RN, BSN Entered By: Zenaida Deed on 08/25/2020 17:01:08 -------------------------------------------------------------------------------- Pain Assessment Details Patient Name: Date of Service: [name], [name] 08/25/2020 3:45 PM Medical Record Number: [id number] Patient Account Number: [id number] Date of Birth/Sex: Treating RN: Sep 06, 1950 (70 y.o. Ricky Lloyd Primary Care  Nikolay Demetriou: Cephus Richer Other Clinician: Referring Holden Maniscalco: Treating Lahoma Constantin/Extender: Armanda Magic Weeks in Treatment: 2 Active Problems Location of Pain Severity and Description of Pain Patient Has Paino No Site Locations Pain Management and Medication Current Pain Management: Electronic Signature(s) Signed: 08/26/2020 5:46:28 PM By: Zandra Abts RN, BSN Entered By: Zandra Abts on 08/25/2020 16:32:25 -------------------------------------------------------------------------------- Patient/Caregiver Education Details Patient Name: Date of Service: [name], [name] 3/30/2022andnbsp3:45 PM Medical Record Number: [id number] Patient Account Number: [id number] Date of Birth/Gender: Treating RN: 05-Jul-1950 (69 y.o. Ricky Lloyd Primary Care Physician: Cephus Richer Other Clinician: Referring Physician: Treating Physician/Extender: Nicanor Bake in Treatment: 2 Education Assessment Education Provided To: Patient Education Topics Provided Nutrition: Offloading: Methods: Explain/Verbal Responses: Reinforcements needed, State content correctly Wound/Skin Impairment: Methods: Explain/Verbal Responses: Reinforcements needed, State content correctly Electronic Signature(s) Signed: 08/25/2020 5:50:20 PM By: Zenaida Deed RN, BSN Entered By: Zenaida Deed on 08/25/2020 17:02:34 -------------------------------------------------------------------------------- Wound Assessment Details Patient Name: Date of Service: [name], [name] 08/25/2020 3:45 PM Medical Record Number: [id number] Patient Account Number: [id number] Date of Birth/Sex: Treating RN: 02/13/1951 (70 y.o. Ricky Lloyd Primary Care Orry Sigl: Cephus Richer Other Clinician: Referring Anis Degidio: Treating Theodus Ran/Extender: Cindy Hazy, [name] in Treatment: 2 Wound Status Wound Number: 1 Primary Diabetic Wound/Ulcer of the  Lower Extremity Etiology: Wound Location: Right, Plantar T Great oe Wound Open Wounding Event: Trauma Status: Date Acquired: 12/27/2017 Comorbid Lymphedema, Sleep Apnea, Arrhythmia, Congestive Heart Failure, Weeks Of Treatment: 2 History: Coronary Artery Disease, Hypertension, Type II Diabetes, Clustered Wound: No Osteoarthritis, Neuropathy Photos Wound Measurements Length: (cm) 0.4 Width: (cm) 0.4 Depth: (cm) 0.2 Area: (cm) 0.126 Volume: (cm) 0.025 % Reduction in Area: 35.7% % Reduction in Volume: 74.5% Epithelialization: Medium (34-66%) Tunneling: No Undermining: Yes Starting Position (o'clock): 12 Ending Position (o'clock): 12 Maximum Distance: (cm) 0.2 Wound Description Classification: Grade 2 Wound Margin: Thickened Exudate Amount: Medium Exudate Type: Serosanguineous Exudate Color: red, brown Foul Odor After Cleansing: No Slough/Fibrino No Wound Bed Granulation Amount: Large (67-100%) Exposed Structure Granulation Quality: Red, Pink Fascia Exposed: No Necrotic Amount: None Present (0%) Fat Layer (Subcutaneous Tissue) Exposed: Yes Tendon Exposed: No Muscle Exposed: No Joint Exposed: No Bone Exposed: No Treatment Notes Wound #1 (Toe Great) Wound Laterality: Plantar, Right Cleanser Peri-Wound Care Topical Primary Dressing KerraCel Ag Gelling Fiber Dressing, 2x2 in (silver alginate) Discharge Instruction: Apply silver alginate to wound bed as instructed Secondary Dressing Woven Gauze Sponges 2x2 in Discharge Instruction: Apply over primary dressing as directed. Optifoam Non-Adhesive Dressing, 4x4 in Discharge Instruction: Apply over primary dressing cut to form donut  to help offload Secured With Conforming Stretch Gauze Bandage, Sterile 2x75 (in/in) Discharge Instruction: Secure with stretch gauze as directed. 69M Medipore H Soft Cloth Surgical T ape, 2x2 (in/yd) Discharge Instruction: Secure dressing with tape as directed. Compression Wrap Compression  Stockings Add-Ons Electronic Signature(s) Signed: 08/25/2020 5:25:01 PM By: Karl Ito Signed: 08/26/2020 5:46:28 PM By: Zandra Abts RN, BSN Entered By: Karl Ito on 08/25/2020 17:24:23 -------------------------------------------------------------------------------- Vitals Details Patient Name: Date of Service: [name], [name] 08/25/2020 3:45 PM Medical Record Number: [id number] Patient Account Number: [id number] Date of Birth/Sex: Treating RN: [birth year] (70 y.o. Ricky Lloyd Primary Care Hilton Saephan: Cephus Richer Other Clinician: Referring Leilah Polimeni: Treating Kinslea Frances/Extender: Cindy Hazy, [name] in Treatment: 2 Vital Signs Time Taken: 16:31 Temperature (F): 97.6 Height (in): 73 Pulse (bpm): 123 Weight (lbs): 390 Respiratory Rate (breaths/min): 20 Body Mass Index (BMI): 51.4 Blood Pressure (mmHg): 107/77 Capillary Blood Glucose (mg/dl): 681 Reference Range: 80 - 120 mg / dl Notes glucose per pt report Electronic Signature(s) Signed: 08/26/2020 5:46:28 PM By: Zandra Abts RN, BSN Entered By: Zandra Abts on 08/25/2020 16:32:20

## 2020-09-01 ENCOUNTER — Encounter (HOSPITAL_BASED_OUTPATIENT_CLINIC_OR_DEPARTMENT_OTHER): Payer: Medicare HMO | Admitting: Physician Assistant

## 2020-09-08 ENCOUNTER — Encounter (HOSPITAL_BASED_OUTPATIENT_CLINIC_OR_DEPARTMENT_OTHER): Payer: Medicare HMO | Attending: Physician Assistant | Admitting: Physician Assistant

## 2020-09-08 ENCOUNTER — Other Ambulatory Visit: Payer: Self-pay

## 2020-09-08 DIAGNOSIS — Z955 Presence of coronary angioplasty implant and graft: Secondary | ICD-10-CM | POA: Diagnosis not present

## 2020-09-08 DIAGNOSIS — I2510 Atherosclerotic heart disease of native coronary artery without angina pectoris: Secondary | ICD-10-CM | POA: Diagnosis not present

## 2020-09-08 DIAGNOSIS — I890 Lymphedema, not elsewhere classified: Secondary | ICD-10-CM | POA: Diagnosis not present

## 2020-09-08 DIAGNOSIS — E1140 Type 2 diabetes mellitus with diabetic neuropathy, unspecified: Secondary | ICD-10-CM | POA: Diagnosis not present

## 2020-09-08 DIAGNOSIS — Z951 Presence of aortocoronary bypass graft: Secondary | ICD-10-CM | POA: Diagnosis not present

## 2020-09-08 DIAGNOSIS — E11621 Type 2 diabetes mellitus with foot ulcer: Secondary | ICD-10-CM | POA: Diagnosis not present

## 2020-09-08 DIAGNOSIS — L97512 Non-pressure chronic ulcer of other part of right foot with fat layer exposed: Secondary | ICD-10-CM | POA: Insufficient documentation

## 2020-09-08 DIAGNOSIS — I5042 Chronic combined systolic (congestive) and diastolic (congestive) heart failure: Secondary | ICD-10-CM | POA: Diagnosis not present

## 2020-09-08 NOTE — Progress Notes (Addendum)
KAYSAN, PEIXOTO ([id number]) Visit Report for 09/08/2020 Chief Complaint Document Details Patient Name: Date of Service: [name], [name] 09/08/2020 3:00 PM Medical Record Number: [id number] Patient Account Number: [id number] Date of Birth/Sex: Treating RN: 1951-04-09 (70 y.o. [name] Primary Care Provider: Cephus Richer Other Clinician: Referring Provider: Treating Provider/Extender: Nicanor Bake in Treatment: 4 Information Obtained from: Patient Chief Complaint Right 1st T Ulcer oe Electronic Signature(s) Signed: 09/08/2020 3:37:59 PM By: Lenda Kelp PA-C Entered By: Lenda Kelp on 09/08/2020 15:37:58 -------------------------------------------------------------------------------- Debridement Details Patient Name: Date of Service: [name], [name] 09/08/2020 3:00 PM Medical Record Number: [id number] Patient Account Number: [id number] Date of Birth/Sex: Treating RN: [birth year] (70 y.o. [name] Primary Care Provider: Cephus Richer Other Clinician: Referring Provider: Treating Provider/Extender: Nicanor Bake in Treatment: 4 Debridement Performed for Assessment: Wound #1 Right,Plantar T Great oe Performed By: Physician Lenda Kelp, PA Debridement Type: Debridement Severity of Tissue Pre Debridement: Fat layer exposed Level of Consciousness (Pre-procedure): Awake and Alert Pre-procedure Verification/Time Out Yes - 16:00 Taken: Start Time: 16:00 Pain Control: Other : benzocaine 20% spray T Area Debrided (L x W): otal 1 (cm) x 1.5 (cm) = 1.5 (cm) Tissue and other material debrided: Non-Viable, Callus, Slough, Subcutaneous, Skin: Epidermis, Slough Level: Skin/Subcutaneous Tissue Debridement Description: Excisional Instrument: Curette Bleeding: Minimum Hemostasis Achieved: Pressure End Time: 16:05 Procedural Pain: 0 Post Procedural Pain: 0 Response to Treatment: Procedure  was tolerated well Level of Consciousness (Post- Awake and Alert procedure): Post Debridement Measurements of Total Wound Length: (cm) 0.4 Width: (cm) 1.5 Depth: (cm) 0.2 Volume: (cm) 0.094 Character of Wound/Ulcer Post Debridement: Improved Severity of Tissue Post Debridement: Fat layer exposed Post Procedure Diagnosis Same as Pre-procedure Electronic Signature(s) Signed: 09/08/2020 5:03:59 PM By: Lenda Kelp PA-C Signed: 09/08/2020 5:38:32 PM By: Zenaida Deed RN, BSN Entered By: Zenaida Deed on 09/08/2020 16:05:41 -------------------------------------------------------------------------------- HPI Details Patient Name: Date of Service: [name], [name] 09/08/2020 3:00 PM Medical Record Number: [id number] Patient Account Number: [id number] Date of Birth/Sex: Treating RN: May 24, 1951 (70 y.o. [name] Primary Care Provider: Cephus Richer Other Clinician: Referring Provider: Treating Provider/Extender: Nicanor Bake in Treatment: 4 History of Present Illness HPI Description: 08/11/2020 upon evaluation today patient presents for initial inspection here in our clinic concerning issues he has been having with wounds over the plantar aspect of his great toe on the right which has been present he tells me since 2019. At that time he was cleaning his pool and scraped his toes on the bottom of the pool. 3 of the 4 toes healed without complication this wound however has never closed. He was previously seen at the wound care center in [place]. He tells me that he is used multiple medications included Hydrofera Blue, Aquacel, and unfortunately many other things none of which were ever completely effective. Has not been placed in a total contact cast in fact he did not even know what that was. He does tell me that Unna boots were used at one time as well as other compression wraps to help with leg ulcerations he does have lymphedema but right now  his legs seem to be doing quite well. His ABI is 1.02 today. He does have a history of an A1c 9.06 June 2020. Currently he has been utilizing Aquacel. He tells me he is out of that however. Again he does  have diabetes mellitus type 2, lymphedema, congestive heart failure, and coronary artery disease. 08/18/2020 upon evaluation today patient appears to be doing well with regard to his toe ulceration. Fortunately there is just a little bit of callus buildup here and I think that we can manage that quite nicely with appropriate dressing changes here. Fortunately there is no signs of active infection at this time. No fevers, chills, nausea, vomiting, or diarrhea. Unfortunately after I saw him last he ended up over the next 24 hours developing an issue with his right lower extremity further up around the knee and into the thigh region as well as the calf where he had a significant cellulitis completely unrelated to the toe. Nonetheless he did require antibiotics he was given Bactrim fortunately that seems to be doing significantly better which is great news he did show me the pictures. He also had an x-ray performed at his pain clinic and I did review that personally today it was actually an image that he brought me which was fairly clear I saw no evidence of obvious bony destruction he did have some obvious signs of arthritis but again nothing that I think is definitive for osteomyelitis at this point all this was discussed with the patient today as well. 08/25/2020 upon evaluation today patient's wound actually showed signs of fairly good granulation and epithelization at this point. He does have some callus around the edges of the wound and unfortunately he does have some slough on the surface of the wound as well. This is going require sharp debridement. There is no signs of infection. 09/08/2020 upon evaluation today patient appears to be doing well with regard to her wound. He has been tolerating the  dressing changes without complication. Fortunately there is no signs of active infection at this time. No fevers, chills, nausea, vomiting, or diarrhea. With that being said he still continues developed callus unfortunately which is causing his main issue here. Electronic Signature(s) Signed: 09/08/2020 4:10:14 PM By: Lenda Kelp PA-C Entered By: Lenda Kelp on 09/08/2020 16:10:13 -------------------------------------------------------------------------------- Physical Exam Details Patient Name: Date of Service: [name], [name] 09/08/2020 3:00 PM Medical Record Number: [id number] Patient Account Number: [id number] Date of Birth/Sex: Treating RN: 1951/05/21 (70 y.o. [name] Primary Care Provider: Cephus Richer Other Clinician: Referring Provider: Treating Provider/Extender: Cindy Hazy, [name] in Treatment: 4 Constitutional Obese and well-hydrated in no acute distress. Respiratory normal breathing without difficulty. Psychiatric this patient is able to make decisions and demonstrates good insight into disease process. Alert and Oriented x 3. pleasant and cooperative. Notes Upon inspection patient's wound bed actually showed signs of some callus buildup around the tip of the toe. He does appear to have a blister off to the lateral side where the wound is. I did remove the callus and in doing so found the blistered area off to the side that did track underneath the callus to this area. All that was cleared away and we should be able to get a good dressing on to the area. Electronic Signature(s) Signed: 09/08/2020 4:10:50 PM By: Lenda Kelp PA-C Entered By: Lenda Kelp on 09/08/2020 16:10:50 -------------------------------------------------------------------------------- Physician Orders Details Patient Name: Date of Service: [name], [name] 09/08/2020 3:00 PM Medical Record Number: [id number] Patient Account Number:  [id number] Date of Birth/Sex: Treating RN: 1950-09-02 (70 y.o. [name] Primary Care Provider: Cephus Richer Other Clinician: Referring Provider: Treating Provider/Extender: Cindy Hazy, Miranda  Weeks in Treatment: 4 Verbal / Phone Orders: No Diagnosis Coding ICD-10 Coding Code Description E11.621 Type 2 diabetes mellitus with foot ulcer L97.512 Non-pressure chronic ulcer of other part of right foot with fat layer exposed E11.40 Type 2 diabetes mellitus with diabetic neuropathy, unspecified I89.0 Lymphedema, not elsewhere classified I50.42 Chronic combined systolic (congestive) and diastolic (congestive) heart failure I25.10 Atherosclerotic heart disease of native coronary artery without angina pectoris Follow-up Appointments ppointment in 1 week. - plan for cast next week (allow extra time) Return A Bathing/ Shower/ Hygiene May shower and wash wound with soap and water. - on days when dressing is cahnged Edema Control - Lymphedema / SCD / Other Bilateral Lower Extremities Elevate legs to the level of the heart or above for 30 minutes daily and/or when sitting, a frequency of: - whenever sitting Avoid standing for long periods of time. Patient to wear own compression stockings every day. Exercise regularly Moisturize legs daily. Off-Loading Wound #1 Right,Plantar T Great oe Open toe surgical shoe to: - right foot Wound Treatment Wound #1 - T Great oe Wound Laterality: Plantar, Right Prim Dressing: KerraCel Ag Gelling Fiber Dressing, 2x2 in (silver alginate) (Dispense As Written) Every Other Day/15 Days ary Discharge Instructions: Apply silver alginate to wound bed as instructed Secondary Dressing: Woven Gauze Sponges 2x2 in (Generic) Every Other Day/15 Days Discharge Instructions: Apply over primary dressing as directed. Secondary Dressing: Optifoam Non-Adhesive Dressing, 4x4 in (Generic) Every Other Day/15 Days Discharge Instructions: Apply over  primary dressing cut to form donut to help offload Secured With: Conforming Stretch Gauze Bandage, Sterile 2x75 (in/in) (Generic) Every Other Day/15 Days Discharge Instructions: Secure with stretch gauze as directed. Secured With: 110M Medipore H Soft Cloth Surgical Tape, 2x2 (in/yd) Every Other Day/15 Days Discharge Instructions: Secure dressing with tape as directed. Electronic Signature(s) Signed: 09/08/2020 5:03:59 PM By: Lenda Kelp PA-C Signed: 09/08/2020 5:38:32 PM By: Zenaida Deed RN, BSN Entered By: Zenaida Deed on 09/08/2020 16:08:38 -------------------------------------------------------------------------------- Problem List Details Patient Name: Date of Service: [name], [name] 09/08/2020 3:00 PM Medical Record Number: [id number] Patient Account Number: [id number] Date of Birth/Sex: Treating RN: [birth year] (70 y.o. [name] Primary Care Provider: Cephus Richer Other Clinician: Referring Provider: Treating Provider/Extender: Nicanor Bake in Treatment: 4 Active Problems ICD-10 Encounter Code Description Active Date MDM Diagnosis E11.621 Type 2 diabetes mellitus with foot ulcer 08/11/2020 No Yes L97.512 Non-pressure chronic ulcer of other part of right foot with fat layer exposed 08/11/2020 No Yes E11.40 Type 2 diabetes mellitus with diabetic neuropathy, unspecified 08/11/2020 No Yes I89.0 Lymphedema, not elsewhere classified 08/11/2020 No Yes I50.42 Chronic combined systolic (congestive) and diastolic (congestive) heart failure 08/11/2020 No Yes I25.10 Atherosclerotic heart disease of native coronary artery without angina pectoris 08/11/2020 No Yes Inactive Problems Resolved Problems Electronic Signature(s) Signed: 09/08/2020 3:37:46 PM By: Lenda Kelp PA-C Entered By: Lenda Kelp on 09/08/2020 15:37:45 -------------------------------------------------------------------------------- Progress Note Details Patient  Name: Date of Service: [name], [name] 09/08/2020 3:00 PM Medical Record Number: [id number] Patient Account Number: [id number] Date of Birth/Sex: Treating RN: 1951/01/25 (70 y.o. [name] Primary Care Provider: Cephus Richer Other Clinician: Referring Provider: Treating Provider/Extender: Nicanor Bake in Treatment: 4 Subjective Chief Complaint Information obtained from Patient Right 1st T Ulcer oe History of Present Illness (HPI) 08/11/2020 upon evaluation today patient presents for initial inspection here in our clinic concerning issues he has been having with wounds over  the plantar aspect of his great toe on the right which has been present he tells me since 2019. At that time he was cleaning his pool and scraped his toes on the bottom of the pool. 3 of the 4 toes healed without complication this wound however has never closed. He was previously seen at the wound care center in [place]. He tells me that he is used multiple medications included Hydrofera Blue, Aquacel, and unfortunately many other things none of which were ever completely effective. Has not been placed in a total contact cast in fact he did not even know what that was. He does tell me that Unna boots were used at one time as well as other compression wraps to help with leg ulcerations he does have lymphedema but right now his legs seem to be doing quite well. His ABI is 1.02 today. He does have a history of an A1c 9.06 June 2020. Currently he has been utilizing Aquacel. He tells me he is out of that however. Again he does have diabetes mellitus type 2, lymphedema, congestive heart failure, and coronary artery disease. 08/18/2020 upon evaluation today patient appears to be doing well with regard to his toe ulceration. Fortunately there is just a little bit of callus buildup here and I think that we can manage that quite nicely with appropriate dressing changes here. Fortunately  there is no signs of active infection at this time. No fevers, chills, nausea, vomiting, or diarrhea. Unfortunately after I saw him last he ended up over the next 24 hours developing an issue with his right lower extremity further up around the knee and into the thigh region as well as the calf where he had a significant cellulitis completely unrelated to the toe. Nonetheless he did require antibiotics he was given Bactrim fortunately that seems to be doing significantly better which is great news he did show me the pictures. He also had an x-ray performed at his pain clinic and I did review that personally today it was actually an image that he brought me which was fairly clear I saw no evidence of obvious bony destruction he did have some obvious signs of arthritis but again nothing that I think is definitive for osteomyelitis at this point all this was discussed with the patient today as well. 08/25/2020 upon evaluation today patient's wound actually showed signs of fairly good granulation and epithelization at this point. He does have some callus around the edges of the wound and unfortunately he does have some slough on the surface of the wound as well. This is going require sharp debridement. There is no signs of infection. 09/08/2020 upon evaluation today patient appears to be doing well with regard to her wound. He has been tolerating the dressing changes without complication. Fortunately there is no signs of active infection at this time. No fevers, chills, nausea, vomiting, or diarrhea. With that being said he still continues developed callus unfortunately which is causing his main issue here. Objective Constitutional Obese and well-hydrated in no acute distress. Vitals Time Taken: 3:16 PM, Height: 73 in, Weight: 390 lbs, BMI: 51.4, Temperature: 98.1 F, Pulse: 79 bpm, Respiratory Rate: 20 breaths/min, Blood Pressure: 115/69 mmHg, Capillary Blood Glucose: 146 mg/dl. Respiratory normal  breathing without difficulty. Psychiatric this patient is able to make decisions and demonstrates good insight into disease process. Alert and Oriented x 3. pleasant and cooperative. General Notes: Upon inspection patient's wound bed actually showed signs of some callus buildup around the tip of the  toe. He does appear to have a blister off to the lateral side where the wound is. I did remove the callus and in doing so found the blistered area off to the side that did track underneath the callus to this area. All that was cleared away and we should be able to get a good dressing on to the area. Integumentary (Hair, Skin) Wound #1 status is Open. Original cause of wound was Trauma. The date acquired was: 12/27/2017. The wound has been in treatment 4 weeks. The wound is located on the [employer]. The wound measures 0.4cm length x 0.3cm width x 0.2cm depth; 0.094cm^2 area and 0.019cm^3 volume. There is Fat oe Layer (Subcutaneous Tissue) exposed. There is no tunneling or undermining noted. There is a medium amount of serosanguineous drainage noted. The wound margin is thickened. There is large (67-100%) red, pink granulation within the wound bed. There is no necrotic tissue within the wound bed. Assessment Active Problems ICD-10 Type 2 diabetes mellitus with foot ulcer Non-pressure chronic ulcer of other part of right foot with fat layer exposed Type 2 diabetes mellitus with diabetic neuropathy, unspecified Lymphedema, not elsewhere classified Chronic combined systolic (congestive) and diastolic (congestive) heart failure Atherosclerotic heart disease of native coronary artery without angina pectoris Procedures Wound #1 Pre-procedure diagnosis of Wound #1 is a Diabetic Wound/Ulcer of the Lower Extremity located on the Right,Plantar T Great .Severity of Tissue Pre oe Debridement is: Fat layer exposed. There was a Excisional Skin/Subcutaneous Tissue Debridement with a total area of 1.5 sq  cm performed by Lenda Kelp, PA. With the following instrument(s): Curette to remove Non-Viable tissue/material. Material removed includes Callus, Subcutaneous Tissue, Slough, and Skin: Epidermis after achieving pain control using Other (benzocaine 20% spray). No specimens were taken. A time out was conducted at 16:00, prior to the start of the procedure. A Minimum amount of bleeding was controlled with Pressure. The procedure was tolerated well with a pain level of 0 throughout and a pain level of 0 following the procedure. Post Debridement Measurements: 0.4cm length x 1.5cm width x 0.2cm depth; 0.094cm^3 volume. Character of Wound/Ulcer Post Debridement is improved. Severity of Tissue Post Debridement is: Fat layer exposed. Post procedure Diagnosis Wound #1: Same as Pre-Procedure Plan Follow-up Appointments: Return Appointment in 1 week. - plan for cast next week (allow extra time) Bathing/ Shower/ Hygiene: May shower and wash wound with soap and water. - on days when dressing is cahnged Edema Control - Lymphedema / SCD / Other: Elevate legs to the level of the heart or above for 30 minutes daily and/or when sitting, a frequency of: - whenever sitting Avoid standing for long periods of time. Patient to wear own compression stockings every day. Exercise regularly Moisturize legs daily. Off-Loading: Wound #1 Right,Plantar T Great: oe Open toe surgical shoe to: - right foot WOUND #1: - T Great Wound Laterality: Plantar, Right oe Prim Dressing: KerraCel Ag Gelling Fiber Dressing, 2x2 in (silver alginate) (Dispense As Written) Every Other Day/15 Days ary Discharge Instructions: Apply silver alginate to wound bed as instructed Secondary Dressing: Woven Gauze Sponges 2x2 in (Generic) Every Other Day/15 Days Discharge Instructions: Apply over primary dressing as directed. Secondary Dressing: Optifoam Non-Adhesive Dressing, 4x4 in (Generic) Every Other Day/15 Days Discharge Instructions:  Apply over primary dressing cut to form donut to help offload Secured With: Conforming Stretch Gauze Bandage, Sterile 2x75 (in/in) (Generic) Every Other Day/15 Days Discharge Instructions: Secure with stretch gauze as directed. Secured With: 67M Medipore H Soft  Cloth Surgical T ape, 2x2 (in/yd) Every Other Day/15 Days Discharge Instructions: Secure dressing with tape as directed. 1. Would recommend that we going continue with the wound care measures as before and the patient is in agreement with the plan that includes the use of the silver alginate dressing which I think is still good. We are using the foam over top for offloading. 2. I am going to suggest that I still think the total contact cast is probably to be the best way to go to try to keep pressure and friction off of this area I think it is more of a friction issue than anything. The patient is in agreement with giving this a try we will plan for that for next week. We will see patient back for reevaluation in 1 week here in the clinic. If anything worsens or changes patient will contact our office for additional recommendations. Electronic Signature(s) Signed: 09/08/2020 4:11:32 PM By: Lenda KelpStone III, Tahjanae Blankenburg PA-C Entered By: Lenda KelpStone III, Zahari Xiang on 09/08/2020 16:11:31 -------------------------------------------------------------------------------- SuperBill Details Patient Name: Date of Service: [name], [name] 09/08/2020 Medical Record Number: [id number] Patient Account Number: [id number] Date of Birth/Sex: Treating RN: [birth year] (70 y.o. [name]) Boehlein, Linda Primary Care Provider: Cephus Richerurner, Miranda Other Clinician: Referring Provider: Treating Provider/Extender: Nicanor BakeStone III, Orel Cooler Turner, [name] in Treatment: 4 Diagnosis Coding ICD-10 Codes Code Description [phone] Type 2 diabetes mellitus with foot ulcer L97.512 Non-pressure chronic ulcer of other part of right foot with fat layer exposed E11.40 Type 2 diabetes mellitus with  diabetic neuropathy, unspecified I89.0 Lymphedema, not elsewhere classified I50.42 Chronic combined systolic (congestive) and diastolic (congestive) heart failure I25.10 Atherosclerotic heart disease of native coronary artery without angina pectoris Facility Procedures CPT4 Code: [id number] Description: 11042 - DEB SUBQ TISSUE 20 SQ CM/< ICD-10 Diagnosis Description L97.512 Non-pressure chronic ulcer of other part of right foot with fat layer exposed Modifier: Quantity: 1 Physician Procedures : CPT4 Code Description Modifier [id number] 11042 - WC PHYS SUBQ TISS 20 SQ CM ICD-10 Diagnosis Description L97.512 Non-pressure chronic ulcer of other part of right foot with fat layer exposed Quantity: 1 Electronic Signature(s) Signed: 09/08/2020 4:11:39 PM By: Lenda KelpStone III, Dreshon Proffit PA-C Entered By: Lenda KelpStone III, Joselyn Edling on 09/08/2020 16:11:39

## 2020-09-09 NOTE — Progress Notes (Signed)
JLEN, WINTLE ([id number]) Visit Report for 09/08/2020 Arrival Information Details Patient Name: Date of Service: [place] [name] 09/08/2020 3:00 PM Medical Record Number: [id number] Patient Account Number: [id number] Date of Birth/Sex: Treating RN: 24-Dec-1950 (70 y.o. Ricky Lloyd Primary Care Iceis Knab: [place] Other Clinician: Referring Rondale Nies: Treating Johncarlos Holtsclaw/Extender: [name] in Treatment: 4 Visit Information History Since Last Visit Added or deleted any medications: No Patient Arrived: Ambulatory Any new allergies or adverse reactions: No Arrival Time: 15:16 Had a fall or experienced change in No Accompanied By: self activities of daily living that may affect Transfer Assistance: None risk of falls: Patient Identification Verified: Yes Signs or symptoms of abuse/neglect since last visito No Secondary Verification Process Completed: Yes Hospitalized since last visit: No Patient Requires Transmission-Based Precautions: No Implantable device outside of the clinic excluding No Patient Has Alerts: No cellular tissue based products placed in the center since last visit: Has Dressing in Place as Prescribed: Yes Pain Present Now: No Electronic Signature(s) Signed: 09/08/2020 4:36:07 PM By: Sandre Kitty Entered By: Sandre Kitty on 09/08/2020 15:16:30 -------------------------------------------------------------------------------- Lower Extremity Assessment Details Patient Name: Date of Service: [name], [name] 09/08/2020 3:00 PM Medical Record Number: [id number] Patient Account Number: [id number] Date of Birth/Sex: Treating RN: 1950/08/12 (70 y.o. Burnadette Pop, Lauren Primary Care Isaiyah Feldhaus: [place] Other Clinician: Referring Alyssabeth Bruster: Treating Marianita Botkin/Extender: [name], [name] in Treatment: 4 Edema Assessment Assessed: [Left: No] [Right: Yes] Edema: [Left: Ye] [Right:  s] Calf Left: Right: Point of Measurement: 33 cm From Medial Instep 52 cm Ankle Left: Right: Point of Measurement: 16 cm From Medial Instep 30.5 cm Vascular Assessment Pulses: Dorsalis Pedis Palpable: [Right:Yes] Posterior Tibial Palpable: [Right:Yes] Electronic Signature(s) Signed: 09/09/2020 5:45:48 PM By: Rhae Hammock RN Entered By: Rhae Hammock on 09/08/2020 15:32:21 -------------------------------------------------------------------------------- Multi-Disciplinary Care Plan Details Patient Name: Date of Service: [name], [name] 09/08/2020 3:00 PM Medical Record Number: [id number] Patient Account Number: [id number] Date of Birth/Sex: Treating RN: 12/12/1950 (70 y.o. Ricky Lloyd Primary Care Linzi Ohlinger: [place] Other Clinician: Referring Florestine Carmical: Treating Nyaisha Simao/Extender: [name] in Treatment: 4 Multidisciplinary Care Plan reviewed with physician Active Inactive Nutrition Nursing Diagnoses: Impaired glucose control: actual or potential Potential for alteratiion in Nutrition/Potential for imbalanced nutrition Goals: Patient/caregiver will maintain therapeutic glucose control Date Initiated: 08/11/2020 Target Resolution Date: 10/06/2020 Goal Status: Active Interventions: Assess HgA1c results as ordered upon admission and as needed Assess patient nutrition upon admission and as needed per policy Provide education on elevated blood sugars and impact on wound healing Treatment Activities: Patient referred to Primary Care Physician for further nutritional evaluation : 08/11/2020 Notes: Wound/Skin Impairment Nursing Diagnoses: Impaired tissue integrity Knowledge deficit related to ulceration/compromised skin integrity Goals: Patient/caregiver will verbalize understanding of skin care regimen Date Initiated: 08/11/2020 Target Resolution Date: 10/06/2020 Goal Status: Active Ulcer/skin breakdown will have a  volume reduction of 30% by week 4 Date Initiated: 08/11/2020 Date Inactivated: 09/08/2020 Target Resolution Date: 09/08/2020 Goal Status: Met Ulcer/skin breakdown will have a volume reduction of 50% by week 8 Date Initiated: 09/08/2020 Target Resolution Date: 10/06/2020 Goal Status: Active Interventions: Assess patient/caregiver ability to obtain necessary supplies Assess patient/caregiver ability to perform ulcer/skin care regimen upon admission and as needed Assess ulceration(s) every visit Provide education on ulcer and skin care Treatment Activities: Skin care regimen initiated : 08/11/2020 Topical wound management initiated : 08/11/2020 Notes: Electronic Signature(s) Signed: 09/08/2020 5:38:32 PM  By: Baruch Gouty RN, BSN Entered By: Baruch Gouty on 09/08/2020 15:20:22 -------------------------------------------------------------------------------- Pain Assessment Details Patient Name: Date of Service: [name], [name] 09/08/2020 3:00 PM Medical Record Number: [id number] Patient Account Number: [id number] Date of Birth/Sex: Treating RN: 1950/08/29 (70 y.o. Ricky Lloyd Primary Care Nimco Bivens: [place] Other Clinician: Referring Yanelly Cantrelle: Treating Aadarsh Cozort/Extender: [name] in Treatment: 4 Active Problems Location of Pain Severity and Description of Pain Patient Has Paino No Site Locations Pain Management and Medication Current Pain Management: Electronic Signature(s) Signed: 09/08/2020 4:36:07 PM By: Sandre Kitty Signed: 09/08/2020 5:38:32 PM By: Baruch Gouty RN, BSN Entered By: Sandre Kitty on 09/08/2020 15:18:21 -------------------------------------------------------------------------------- Patient/Caregiver Education Details Patient Name: Date of Service: [name], [name] 4/13/2022andnbsp3:00 PM Medical Record Number: [id number] Patient Account Number: [id number] Date of Birth/Gender: Treating  RN: 02-01-1951 (69 y.o. Ricky Lloyd Primary Care Physician: [place] Other Clinician: Referring Physician: Treating Physician/Extender: [name] in Treatment: 4 Education Assessment Education Provided To: Patient Education Topics Provided Offloading: Methods: Explain/Verbal Responses: Reinforcements needed, State content correctly Wound/Skin Impairment: Methods: Explain/Verbal Responses: Reinforcements needed, State content correctly Electronic Signature(s) Signed: 09/08/2020 5:38:32 PM By: Baruch Gouty RN, BSN Entered By: Baruch Gouty on 09/08/2020 15:21:36 -------------------------------------------------------------------------------- Wound Assessment Details Patient Name: Date of Service: [name], [name] 09/08/2020 3:00 PM Medical Record Number: [id number] Patient Account Number: [id number] Date of Birth/Sex: Treating RN: 23-May-1951 (70 y.o. Ricky Lloyd Primary Care Rehana Uncapher: [place] Other Clinician: Referring Muscab Brenneman: Treating Shlomie Romig/Extender: [name], [name] in Treatment: 4 Wound Status Wound Number: 1 Primary Diabetic Wound/Ulcer of the Lower Extremity Etiology: Wound Location: Right, Plantar T Great oe Wound Open Wounding Event: Trauma Status: Date Acquired: 12/27/2017 Comorbid Lymphedema, Sleep Apnea, Arrhythmia, Congestive Heart Failure, Weeks Of Treatment: 4 History: Coronary Artery Disease, Hypertension, Type II Diabetes, Clustered Wound: No Osteoarthritis, Neuropathy Photos Wound Measurements Length: (cm) 0.4 Width: (cm) 0.3 Depth: (cm) 0.2 Area: (cm) 0.094 Volume: (cm) 0.019 % Reduction in Area: 52% % Reduction in Volume: 80.6% Epithelialization: Medium (34-66%) Tunneling: No Undermining: No Wound Description Classification: Grade 2 Wound Margin: Thickened Exudate Amount: Medium Exudate Type: Serosanguineous Exudate Color: red, brown Wound  Bed Granulation Amount: Large (67-100%) Granulation Quality: Red, Pink Necrotic Amount: None Present (0%) Foul Odor After Cleansing: No Slough/Fibrino No Exposed Structure Fascia Exposed: No Fat Layer (Subcutaneous Tissue) Exposed: Yes Tendon Exposed: No Muscle Exposed: No Joint Exposed: No Bone Exposed: No Electronic Signature(s) Signed: 09/08/2020 4:36:07 PM By: Sandre Kitty Signed: 09/08/2020 5:38:32 PM By: Baruch Gouty RN, BSN Entered By: Sandre Kitty on 09/08/2020 16:35:32 -------------------------------------------------------------------------------- Vitals Details Patient Name: Date of Service: [name], [name] 09/08/2020 3:00 PM Medical Record Number: [id number] Patient Account Number: [id number] Date of Birth/Sex: Treating RN: June 03, 1950 (70 y.o. Ricky Lloyd Primary Care Emmamarie Kluender: [place] Other Clinician: Referring Donella Pascarella: Treating Willodene Stallings/Extender: [name], [name] in Treatment: 4 Vital Signs Time Taken: 15:16 Temperature (F): 98.1 Height (in): 73 Pulse (bpm): 79 Weight (lbs): 390 Respiratory Rate (breaths/min): 20 Body Mass Index (BMI): 51.4 Blood Pressure (mmHg): 115/69 Capillary Blood Glucose (mg/dl): 146 Reference Range: 80 - 120 mg / dl Electronic Signature(s) Signed: 09/08/2020 4:36:07 PM By: Sandre Kitty Entered By: Sandre Kitty on 09/08/2020 15:18:17

## 2020-09-16 ENCOUNTER — Encounter (HOSPITAL_BASED_OUTPATIENT_CLINIC_OR_DEPARTMENT_OTHER): Payer: Medicare HMO | Admitting: Internal Medicine

## 2020-09-16 ENCOUNTER — Other Ambulatory Visit: Payer: Self-pay

## 2020-09-16 DIAGNOSIS — E11621 Type 2 diabetes mellitus with foot ulcer: Secondary | ICD-10-CM | POA: Diagnosis not present

## 2020-09-16 NOTE — Progress Notes (Signed)
EULAS, SCHWEITZER ([id number]) Visit Report for 09/16/2020 Chief Complaint Document Details Patient Name: Date of Service: [name], [name] 09/16/2020 2:15 PM Medical Record Number: [id number] Patient Account Number: [id number] Date of Birth/Sex: Treating RN: 1950-07-04 (70 y.o. Ricky Lloyd Primary Care Provider: Cephus Richer Other Clinician: Referring Provider: Treating Provider/Extender: Marrian Salvage in Treatment: 5 Information Obtained from: Patient Chief Complaint Right 1st T Ulcer oe Electronic Signature(s) Signed: 09/16/2020 5:04:44 PM By: Geralyn Corwin DO Entered By: Geralyn Corwin on 09/16/2020 16:56:27 -------------------------------------------------------------------------------- Debridement Details Patient Name: Date of Service: [name], [name] 09/16/2020 2:15 PM Medical Record Number: [id number] Patient Account Number: [id number] Date of Birth/Sex: Treating RN: Aug 09, 1950 (70 y.o. Ricky Lloyd Primary Care Provider: Cephus Richer Other Clinician: Referring Provider: Treating Provider/Extender: Marrian Salvage in Treatment: 5 Debridement Performed for Assessment: Wound #1 Right,Plantar T Great oe Performed By: Physician Geralyn Corwin, DO Debridement Type: Debridement Severity of Tissue Pre Debridement: Fat layer exposed Level of Consciousness (Pre-procedure): Awake and Alert Pre-procedure Verification/Time Out Yes - 15:10 Taken: Start Time: 15:11 Pain Control: Lidocaine 4% T opical Solution T Area Debrided (L x W): otal 0.5 (cm) x 0.5 (cm) = 0.25 (cm) Tissue and other material debrided: Viable, Non-Viable, Callus, Subcutaneous, Skin: Dermis , Fibrin/Exudate Level: Skin/Subcutaneous Tissue Debridement Description: Excisional Instrument: Curette Bleeding: Minimum Hemostasis Achieved: Pressure End Time: 15:15 Procedural Pain: 0 Post Procedural Pain: 0 Response to Treatment:  Procedure was tolerated well Level of Consciousness (Post- Awake and Alert procedure): Post Debridement Measurements of Total Wound Length: (cm) 0.3 Width: (cm) 0.3 Depth: (cm) 0.2 Volume: (cm) 0.014 Character of Wound/Ulcer Post Debridement: Improved Severity of Tissue Post Debridement: Fat layer exposed Post Procedure Diagnosis Same as Pre-procedure Electronic Signature(s) Signed: 09/16/2020 5:04:44 PM By: Geralyn Corwin DO Signed: 09/16/2020 5:27:07 PM By: Shawn Stall Entered By: Shawn Stall on 09/16/2020 15:16:08 -------------------------------------------------------------------------------- HPI Details Patient Name: Date of Service: [name], [name] 09/16/2020 2:15 PM Medical Record Number: [id number] Patient Account Number: [id number] Date of Birth/Sex: Treating RN: 1950/10/25 (70 y.o. Ricky Lloyd Primary Care Provider: Cephus Richer Other Clinician: Referring Provider: Treating Provider/Extender: Marrian Salvage in Treatment: 5 History of Present Illness HPI Description: 08/11/2020 upon evaluation today patient presents for initial inspection here in our clinic concerning issues he has been having with wounds over the plantar aspect of his great toe on the right which has been present he tells me since 2019. At that time he was cleaning his pool and scraped his toes on the bottom of the pool. 3 of the 4 toes healed without complication this wound however has never closed. He was previously seen at the wound care center in [place]. He tells me that he is used multiple medications included Hydrofera Blue, Aquacel, and unfortunately many other things none of which were ever completely effective. Has not been placed in a total contact cast in fact he did not even know what that was. He does tell me that Unna boots were used at one time as well as other compression wraps to help with leg ulcerations he does have lymphedema but right now his  legs seem to be doing quite well. His ABI is 1.02 today. He does have a history of an A1c 9.06 June 2020. Currently he has been utilizing Aquacel. He tells me he is out of that however. Again he does have diabetes mellitus type 2, lymphedema, congestive heart  failure, and coronary artery disease. 08/18/2020 upon evaluation today patient appears to be doing well with regard to his toe ulceration. Fortunately there is just a little bit of callus buildup here and I think that we can manage that quite nicely with appropriate dressing changes here. Fortunately there is no signs of active infection at this time. No fevers, chills, nausea, vomiting, or diarrhea. Unfortunately after I saw him last he ended up over the next 24 hours developing an issue with his right lower extremity further up around the knee and into the thigh region as well as the calf where he had a significant cellulitis completely unrelated to the toe. Nonetheless he did require antibiotics he was given Bactrim fortunately that seems to be doing significantly better which is great news he did show me the pictures. He also had an x-ray performed at his pain clinic and I did review that personally today it was actually an image that he brought me which was fairly clear I saw no evidence of obvious bony destruction he did have some obvious signs of arthritis but again nothing that I think is definitive for osteomyelitis at this point all this was discussed with the patient today as well. 08/25/2020 upon evaluation today patient's wound actually showed signs of fairly good granulation and epithelization at this point. He does have some callus around the edges of the wound and unfortunately he does have some slough on the surface of the wound as well. This is going require sharp debridement. There is no signs of infection. 09/08/2020 upon evaluation today patient appears to be doing well with regard to her wound. He has been tolerating the  dressing changes without complication. Fortunately there is no signs of active infection at this time. No fevers, chills, nausea, vomiting, or diarrhea. With that being said he still continues developed callus unfortunately which is causing his main issue here. 09/16/2020 patient presents today for 69 week follow-up of his right great toe diabetic foot ulcer. He would like to have a total contact cast placed. He has been using silver alginate to the wound every other day without issues. He denies any fever/chills or purulent drainage. He overall feels well Electronic Signature(s) Signed: 09/16/2020 5:04:44 PM By: Geralyn CorwinHoffman, Rhiley Solem DO Entered By: Geralyn CorwinHoffman, Marcellino Fidalgo on 09/16/2020 16:57:55 -------------------------------------------------------------------------------- Physical Exam Details Patient Name: Date of Service: [name], [name] 09/16/2020 2:15 PM Medical Record Number: [id number] Patient Account Number: [id number] Date of Birth/Sex: Treating RN: 1950-12-04 (70 y.o. Ricky SoursM) Deaton, Bobbi Primary Care Provider: Cephus Richerurner, Miranda Other Clinician: Referring Provider: Treating Provider/Extender: Osie BondHoffman, Geronimo Diliberto Turner, [name] in Treatment: 5 Constitutional respirations regular, non-labored and within target range for patient.. Cardiovascular 2+ dorsalis pedis/posterior tibialis pulses. Psychiatric pleasant and cooperative. Notes Right great toe: Callus circumferentially to the wound bed. There is granulation tissue present to the wound bed. The callus was debrided. No signs of infection. Electronic Signature(s) Signed: 09/16/2020 5:04:44 PM By: Geralyn CorwinHoffman, Hildreth Orsak DO Entered By: Geralyn CorwinHoffman, Donnis Pecha on 09/16/2020 17:00:11 -------------------------------------------------------------------------------- Physician Orders Details Patient Name: Date of Service: [name], [name] 09/16/2020 2:15 PM Medical Record Number: [id number] Patient Account Number: [id number] Date of Birth/Sex:  Treating RN: 1950-12-04 [age] y.o. Ricky SoursM) Deaton, Bobbi Primary Care Provider: Cephus Richerurner, Miranda Other Clinician: Referring Provider: Treating Provider/Extender: Marrian SalvageHoffman, Jiyaan Steinhauser Turner, [name] in Treatment: 5 Verbal / Phone Orders: No Diagnosis Coding ICD-10 Coding Code Description E11.621 Type 2 diabetes mellitus with foot ulcer L97.512 Non-pressure chronic ulcer of other part of right  foot with fat layer exposed E11.40 Type 2 diabetes mellitus with diabetic neuropathy, unspecified I89.0 Lymphedema, not elsewhere classified I50.42 Chronic combined systolic (congestive) and diastolic (congestive) heart failure I25.10 Atherosclerotic heart disease of native coronary artery without angina pectoris Follow-up Appointments ppointment in: - Monday or Tuesday for cast- extra time. Return A Bathing/ Shower/ Hygiene May shower and wash wound with soap and water. - on days when dressing is cahnged Edema Control - Lymphedema / SCD / Other Bilateral Lower Extremities Elevate legs to the level of the heart or above for 30 minutes daily and/or when sitting, a frequency of: - whenever sitting Avoid standing for long periods of time. Patient to wear own compression stockings every day. Exercise regularly Moisturize legs daily. - left leg every night. Off-Loading Wound #1 Right,Plantar T Great oe Total Contact Cast to Right Lower Extremity Wound Treatment Wound #1 - T Great oe Wound Laterality: Plantar, Right Prim Dressing: KerraCel Ag Gelling Fiber Dressing, 2x2 in (silver alginate) (Dispense As Written) 1 x Per Week/15 Days ary Discharge Instructions: Apply silver alginate to wound bed as instructed Secondary Dressing: Woven Gauze Sponges 2x2 in (Generic) 1 x Per Week/15 Days Discharge Instructions: Apply over primary dressing as directed. Secondary Dressing: Optifoam Non-Adhesive Dressing, 4x4 in (Generic) 1 x Per Week/15 Days Discharge Instructions: Apply over primary dressing cut to  form donut to help offload Electronic Signature(s) Signed: 09/16/2020 5:04:44 PM By: Geralyn Corwin DO Entered By: Geralyn Corwin on 09/16/2020 17:00:28 -------------------------------------------------------------------------------- Problem List Details Patient Name: Date of Service: [name], [name] 09/16/2020 2:15 PM Medical Record Number: [id number] Patient Account Number: [id number] Date of Birth/Sex: Treating RN: 1950/12/04 (70 y.o. Ricky Lloyd Primary Care Provider: Cephus Richer Other Clinician: Referring Provider: Treating Provider/Extender: Marrian Salvage in Treatment: 5 Active Problems ICD-10 Encounter Code Description Active Date MDM Diagnosis E11.621 Type 2 diabetes mellitus with foot ulcer 08/11/2020 No Yes L97.512 Non-pressure chronic ulcer of other part of right foot with fat layer exposed 08/11/2020 No Yes E11.40 Type 2 diabetes mellitus with diabetic neuropathy, unspecified 08/11/2020 No Yes I89.0 Lymphedema, not elsewhere classified 08/11/2020 No Yes I50.42 Chronic combined systolic (congestive) and diastolic (congestive) heart failure 08/11/2020 No Yes I25.10 Atherosclerotic heart disease of native coronary artery without angina pectoris 08/11/2020 No Yes Inactive Problems Resolved Problems Electronic Signature(s) Signed: 09/16/2020 5:04:44 PM By: Geralyn Corwin DO Entered By: Geralyn Corwin on 09/16/2020 16:59:51 -------------------------------------------------------------------------------- Progress Note Details Patient Name: Date of Service: [name], [name] 09/16/2020 2:15 PM Medical Record Number: [id number] Patient Account Number: [id number] Date of Birth/Sex: Treating RN: [birth year] (70 y.o. Ricky Lloyd Primary Care Provider: Cephus Richer Other Clinician: Referring Provider: Treating Provider/Extender: Marrian Salvage in Treatment: 5 Subjective Chief Complaint Information  obtained from Patient Right 1st T Ulcer oe History of Present Illness (HPI) 08/11/2020 upon evaluation today patient presents for initial inspection here in our clinic concerning issues he has been having with wounds over the plantar aspect of his great toe on the right which has been present he tells me since 2019. At that time he was cleaning his pool and scraped his toes on the bottom of the pool. 3 of the 4 toes healed without complication this wound however has never closed. He was previously seen at the wound care center in [place]. He tells me that he is used multiple medications included Hydrofera Blue, Aquacel, and unfortunately many other things none of which were ever completely effective.  Has not been placed in a total contact cast in fact he did not even know what that was. He does tell me that Unna boots were used at one time as well as other compression wraps to help with leg ulcerations he does have lymphedema but right now his legs seem to be doing quite well. His ABI is 1.02 today. He does have a history of an A1c 9.06 June 2020. Currently he has been utilizing Aquacel. He tells me he is out of that however. Again he does have diabetes mellitus type 2, lymphedema, congestive heart failure, and coronary artery disease. 08/18/2020 upon evaluation today patient appears to be doing well with regard to his toe ulceration. Fortunately there is just a little bit of callus buildup here and I think that we can manage that quite nicely with appropriate dressing changes here. Fortunately there is no signs of active infection at this time. No fevers, chills, nausea, vomiting, or diarrhea. Unfortunately after I saw him last he ended up over the next 24 hours developing an issue with his right lower extremity further up around the knee and into the thigh region as well as the calf where he had a significant cellulitis completely unrelated to the toe. Nonetheless he did require antibiotics he  was given Bactrim fortunately that seems to be doing significantly better which is great news he did show me the pictures. He also had an x-ray performed at his pain clinic and I did review that personally today it was actually an image that he brought me which was fairly clear I saw no evidence of obvious bony destruction he did have some obvious signs of arthritis but again nothing that I think is definitive for osteomyelitis at this point all this was discussed with the patient today as well. 08/25/2020 upon evaluation today patient's wound actually showed signs of fairly good granulation and epithelization at this point. He does have some callus around the edges of the wound and unfortunately he does have some slough on the surface of the wound as well. This is going require sharp debridement. There is no signs of infection. 09/08/2020 upon evaluation today patient appears to be doing well with regard to her wound. He has been tolerating the dressing changes without complication. Fortunately there is no signs of active infection at this time. No fevers, chills, nausea, vomiting, or diarrhea. With that being said he still continues developed callus unfortunately which is causing his main issue here. 09/16/2020 patient presents today for 69 week follow-up of his right great toe diabetic foot ulcer. He would like to have a total contact cast placed. He has been using silver alginate to the wound every other day without issues. He denies any fever/chills or purulent drainage. He overall feels well Patient History Information obtained from Patient. Family History Unknown History. Social History Never smoker, Marital Status - Married, Alcohol Use - Never, Drug Use - No History, Caffeine Use - Rarely. 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 Vitals Time Taken: 2:25 PM, Weight: 390 lbs, Temperature: 98.0 F, Pulse: 79 bpm, Respiratory Rate: 20 breaths/min,  Blood Pressure: 106/59 mmHg. Cardiovascular 2+ dorsalis pedis/posterior tibialis pulses. Psychiatric pleasant and cooperative. General Notes: Right great toe: Callus circumferentially to the wound bed. There is granulation tissue present to the wound bed. The callus was debrided. No signs of infection. Integumentary (Hair, Skin) Wound #1 status is Open. Original cause of wound was Trauma. The date acquired was: 12/27/2017. The wound has been in treatment 5 weeks. The wound is located on the [employer]. The wound measures 0.3cm length x 0.3cm width x 0.2cm depth; 0.071cm^2 area and 0.014cm^3 volume. There is Fat oe Layer (Subcutaneous Tissue) exposed. There is no tunneling or undermining noted. There is a medium amount of serosanguineous drainage noted. The wound margin is well defined and not attached to the wound base. There is large (67-100%) red, pink granulation within the wound bed. There is no necrotic tissue within the wound bed. General Notes: Calloused periwound Assessment Active Problems ICD-10 Type 2 diabetes mellitus with foot ulcer Non-pressure chronic ulcer of other part of right foot with fat layer exposed Type 2 diabetes mellitus with diabetic neuropathy, unspecified Lymphedema, not elsewhere classified Chronic combined systolic (congestive) and diastolic (congestive) heart failure Atherosclerotic heart disease of native coronary artery without angina pectoris Patient's right great toe plantar  ulcer continues to show improvement in healing. He presents for a total contact cast today. This was placed with no issues and he will follow-up early next week. I continued silver alginate to the wound bed. Procedures Wound #1 Pre-procedure diagnosis of Wound #1 is a Diabetic Wound/Ulcer of the Lower Extremity located on the Right,Plantar T Great .Severity of Tissue Pre oe Debridement is: Fat layer exposed. There was a Excisional Skin/Subcutaneous Tissue Debridement with a total area of 0.25 sq cm performed by Geralyn Corwin, DO. With the following instrument(s): Curette to remove Viable and Non-Viable tissue/material. Material removed includes Callus, Subcutaneous Tissue, Skin: Dermis, and Fibrin/Exudate after achieving pain control using Lidocaine 4% Topical Solution. A time out was conducted at 15:10, prior to the start of the procedure. A Minimum amount of bleeding was controlled with Pressure. The procedure was tolerated well with a pain level of 0 throughout and a pain level of 0 following the procedure. Post Debridement Measurements: 0.3cm length x 0.3cm width x 0.2cm depth; 0.014cm^3 volume. Character of Wound/Ulcer Post Debridement is improved. Severity of Tissue Post Debridement is: Fat layer exposed. Post procedure Diagnosis Wound #1: Same as Pre-Procedure Pre-procedure diagnosis of Wound #1 is a Diabetic Wound/Ulcer of the Lower Extremity located on the Right,Plantar T Great . There was a T Contact oe otal Cast Procedure by Geralyn Corwin, DO. Post procedure Diagnosis Wound #1: Same as Pre-Procedure Plan Follow-up Appointments: Return Appointment in: - Monday or Tuesday for cast- extra time. Bathing/ Shower/ Hygiene: May shower and wash wound with soap and water. - on days when dressing is cahnged Edema Control - Lymphedema / SCD / Other: Elevate legs to the level of the heart or above for 30 minutes daily and/or when sitting, a frequency of: - whenever sitting Avoid standing  for long periods of time. Patient to wear own compression stockings every day. Exercise regularly Moisturize legs daily. - left leg every night. Off-Loading: Wound #1 Right,Plantar T Great: oe T Contact Cast to Right Lower Extremity otal WOUND #1: - T Great Wound Laterality: Plantar, Right oe Prim Dressing: KerraCel Ag Gelling Fiber Dressing, 2x2 in (silver alginate) (Dispense As Written) 1 x Per Week/15 Days ary Discharge Instructions: Apply silver alginate to wound bed as  instructed Secondary Dressing: Woven Gauze Sponges 2x2 in (Generic) 1 x Per Week/15 Days Discharge Instructions: Apply over primary dressing as directed. Secondary Dressing: Optifoam Non-Adhesive Dressing, 4x4 in (Generic) 1 x Per Week/15 Days Discharge Instructions: Apply over primary dressing cut to form donut to help offload 1. T contact cast placed today otal 2. In office sharp debridement 3. Follow-up early next week for cast change 4. Silver alginate Electronic Signature(s) Signed: 09/16/2020 5:04:44 PM By: Geralyn Corwin DO Entered By: Geralyn Corwin on 09/16/2020 17:02:34 -------------------------------------------------------------------------------- HxROS Details Patient Name: Date of Service: [name], [name] 09/16/2020 2:15 PM Medical Record Number: [id number] Patient Account Number: [id number] Date of Birth/Sex: Treating RN: December 21, 1950 (70 y.o. Ricky Lloyd Primary Care Provider: Cephus Richer Other Clinician: Referring Provider: Treating Provider/Extender: Marrian Salvage in Treatment: 5 Information Obtained From Patient Hematologic/Lymphatic Medical History: Positive for: Lymphedema - lower legs Respiratory Medical History: Positive for: Sleep Apnea - CPAP Cardiovascular Medical History: Positive for: Arrhythmia - A-fib; Congestive Heart Failure; Coronary Artery Disease - s/p CABG w/5 stents; Hypertension Endocrine Medical History: Positive  for: Type II Diabetes Time with diabetes: over 10 year Treated with: Insulin Blood sugar tested every day: Yes Tested : 2-3 times a day Musculoskeletal Medical History: Positive for: Osteoarthritis Negative for: Gout Neurologic Medical History: Positive for: Neuropathy Immunizations Pneumococcal Vaccine: Received Pneumococcal Vaccination: Yes Implantable Devices None Family and Social History Unknown History: Yes; Never smoker; Marital Status - Married; Alcohol Use: Never; Drug Use: No History; Caffeine Use: Rarely; Financial Concerns: No; Food, Clothing or Shelter Needs: No; Support System Lacking: No; Transportation Concerns: No Electronic Signature(s) Signed: 09/16/2020 5:04:44 PM By: Geralyn Corwin DO Signed: 09/16/2020 5:27:07 PM By: Shawn Stall Entered By: Geralyn Corwin on 09/16/2020 17:00:00 -------------------------------------------------------------------------------- Total Contact Cast Details Patient Name: Date of Service: [name], [name] 09/16/2020 2:15 PM Medical Record Number: [id number] Patient Account Number: [id number] Date of Birth/Sex: Treating RN: 1951-05-01 (70 y.o. Ricky Lloyd Primary Care Provider: Cephus Richer Other Clinician: Referring Provider: Treating Provider/Extender: Marrian Salvage in Treatment: 5 T Contact Cast Applied for Wound Assessment: otal Wound #1 Right,Plantar T Great oe Performed By: Physician Geralyn Corwin, DO Post Procedure Diagnosis Same as Pre-procedure Electronic Signature(s) Signed: 09/16/2020 5:04:44 PM By: Geralyn Corwin DO Signed: 09/16/2020 5:27:07 PM By: Shawn Stall Entered By: Shawn Stall on 09/16/2020 15:16:38 -------------------------------------------------------------------------------- SuperBill Details Patient Name: Date of Service: [name], [name] 09/16/2020 Medical Record Number: [id number] Patient Account Number: [id number] Date of  Birth/Sex: Treating RN: 04/08/1951 (70 y.o. Ricky Lloyd Primary Care Provider: Cephus Richer Other Clinician: Referring Provider: Treating Provider/Extender: Marrian Salvage in Treatment: 5 Diagnosis Coding ICD-10 Codes Code Description [phone] Type 2 diabetes mellitus with foot ulcer L97.512 Non-pressure chronic ulcer of other part of right foot with fat layer exposed E11.40 Type 2 diabetes mellitus with diabetic neuropathy, unspecified I89.0 Lymphedema, not elsewhere classified I50.42 Chronic combined systolic (congestive) and diastolic (congestive) heart failure I25.10 Atherosclerotic heart disease of native coronary artery without angina pectoris Facility Procedures CPT4 Code: [id number] Description: 11042 - DEB SUBQ TISSUE 20 SQ CM/< Modifier: Quantity: 1 Electronic Signature(s) Signed: 09/16/2020 5:04:44 PM By: Geralyn Corwin DO Entered By: Geralyn Corwin on 09/16/2020 17:03:53

## 2020-09-21 ENCOUNTER — Other Ambulatory Visit: Payer: Self-pay

## 2020-09-21 ENCOUNTER — Encounter (HOSPITAL_BASED_OUTPATIENT_CLINIC_OR_DEPARTMENT_OTHER): Payer: Medicare HMO | Admitting: Internal Medicine

## 2020-09-21 DIAGNOSIS — E11621 Type 2 diabetes mellitus with foot ulcer: Secondary | ICD-10-CM | POA: Diagnosis not present

## 2020-09-21 NOTE — Progress Notes (Signed)
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 09/21/2020 2:45 PM Medical Record Number: [id number] Patient Account Number: [id number] Date of Birth/Sex: Treating RN: 12/11/1950 (70 y.o. Ricky Lloyd Primary Care Provider: Berlin Hun Other Clinician: Referring Provider: Treating Provider/Extender: Feliz Beam in Treatment: 5 Visit Information History Since Last Visit Added or deleted any medications: No Patient Arrived: Ambulatory Any new allergies or adverse reactions: No Arrival Time: 15:43 Had a fall or experienced change in No Accompanied By: self activities of daily living that may affect Transfer Assistance: None risk of falls: Patient Identification Verified: Yes Signs or symptoms of abuse/neglect since last visito No Secondary Verification Process Completed: Yes Hospitalized since last visit: No Patient Requires Transmission-Based Precautions: No Implantable device outside of the clinic excluding No Patient Has Alerts: No cellular tissue based products placed in the center since last visit: Has Dressing in Place as Prescribed: Yes Has Footwear/Offloading in Place as Prescribed: Yes Right: T Contact Cast otal Pain Present Now: No Electronic Signature(s) Signed: 09/21/2020 6:08:37 PM By: Deon Pilling Entered By: Deon Pilling on 09/21/2020 15:48:13 -------------------------------------------------------------------------------- Encounter Discharge Information Details Patient Name: Date of Service: [name], [name] 09/21/2020 2:45 PM Medical Record Number: [id number] Patient Account Number: [id number] Date of Birth/Sex: Treating RN: 03/16/1951 (70 y.o. Ricky Lloyd Primary Care Provider: Berlin Hun Other Clinician: Referring Provider: Treating Provider/Extender: Feliz Beam in Treatment:  5 Encounter Discharge Information Items Post Procedure Vitals Discharge Condition: Stable Temperature (F): 98.4 Ambulatory Status: Ambulatory Pulse (bpm): 76 Discharge Destination: Home Respiratory Rate (breaths/min): 18 Transportation: Private Auto Blood Pressure (mmHg): 112/73 Accompanied By: self Schedule Follow-up Appointment: Yes Clinical Summary of Care: Electronic Signature(s) Signed: 09/21/2020 6:08:37 PM By: Deon Pilling Entered By: Deon Pilling on 09/21/2020 17:35:19 -------------------------------------------------------------------------------- Lower Extremity Assessment Details Patient Name: Date of Service: [name], [name] 09/21/2020 2:45 PM Medical Record Number: [id number] Patient Account Number: [id number] Date of Birth/Sex: Treating RN: Jan 23, 1951 (70 y.o. Ricky Lloyd Primary Care Provider: Berlin Hun Other Clinician: Referring Provider: Treating Provider/Extender: Darleen Crocker Weeks in Treatment: 5 Edema Assessment Assessed: [Left: No] [Right: Yes] Edema: [Left: Ye] [Right: s] Calf Left: Right: Point of Measurement: 33 cm From Medial Instep 51 cm Ankle Left: Right: Point of Measurement: 16 cm From Medial Instep 31 cm Vascular Assessment Pulses: Dorsalis Pedis Palpable: [Right:Yes] Electronic Signature(s) Signed: 09/21/2020 6:08:37 PM By: Deon Pilling Entered By: Deon Pilling on 09/21/2020 15:53:49 -------------------------------------------------------------------------------- Multi Wound Chart Details Patient Name: Date of Service: [name], [name] 09/21/2020 2:45 PM Medical Record Number: [id number] Patient Account Number: [id number] Date of Birth/Sex: Treating RN: Mar 02, 1951 (70 y.o. Ricky Lloyd Primary Care Provider: Berlin Hun Other Clinician: Referring Provider: Treating Provider/Extender: Feliz Beam in Treatment: 5 Vital Signs Height(in): Capillary  Blood Glucose(mg/dl): 115 Weight(lbs): 390 Pulse(bpm): 72 Body Mass Index(BMI): Blood Pressure(mmHg): 112/73 Temperature(F): 98.4 Respiratory Rate(breaths/min): 18 Photos: [1:Right, Plantar T Great oe] [N/A:N/A N/A] Wound Location: [1:Trauma] [N/A:N/A] Wounding Event: [1:Diabetic Wound/Ulcer of the Lower] [N/A:N/A] Primary Etiology: [1:Extremity Lymphedema, Sleep Apnea,] [N/A:N/A] Comorbid History: [1:Arrhythmia, Congestive Heart Failure, Coronary Artery Disease, Hypertension, Type II Diabetes, Osteoarthritis, Neuropathy 12/27/2017] [N/A:N/A] Date Acquired: [1:5] [N/A:N/A] Weeks of Treatment: [1:Open] [N/A:N/A] Wound Status: [1:0.4x0.4x0.4] [N/A:N/A] Measurements L x W x D (cm) [1:0.126] [N/A:N/A] A (cm) : rea [1:0.05] [N/A:N/A] Volume (cm) : [1:35.70%] [N/A:N/A] %  Reduction in A [1:rea: 49.00%] [N/A:N/A] % Reduction in Volume: [1:Grade 2] [N/A:N/A] Classification: [1:Medium] [N/A:N/A] Exudate A mount: [1:Serosanguineous] [N/A:N/A] Exudate Type: [1:red, brown] [N/A:N/A] Exudate Color: [1:Well defined, not attached] [N/A:N/A] Wound Margin: [1:Large (67-100%)] [N/A:N/A] Granulation A mount: [1:Red, Pink] [N/A:N/A] Granulation Quality: [1:None Present (0%)] [N/A:N/A] Necrotic A mount: [1:Fat Layer (Subcutaneous Tissue): Yes N/A] Exposed Structures: [1:Fascia: No Tendon: No Muscle: No Joint: No Bone: No Medium (34-66%)] [N/A:N/A] Epithelialization: [1:Debridement - Excisional] [N/A:N/A] Debridement: Pre-procedure Verification/Time Out 14:35 [N/A:N/A] Taken: [1:Lidocaine 4% Topical Solution] [N/A:N/A] Pain Control: [1:Callus, Subcutaneous, Slough] [N/A:N/A] Tissue Debrided: [1:Skin/Subcutaneous Tissue] [N/A:N/A] Level: [1:0.25] [N/A:N/A] Debridement A (sq cm): [1:rea Curette] [N/A:N/A] Instrument: [1:Minimum] [N/A:N/A] Bleeding: [1:Pressure] [N/A:N/A] Hemostasis A chieved: [1:0] [N/A:N/A] Procedural Pain: [1:0] [N/A:N/A] Post Procedural Pain: [1:Procedure was tolerated well]  [N/A:N/A] Debridement Treatment Response: [1:0.4x0.4x0.4] [N/A:N/A] Post Debridement Measurements L x W x D (cm) [1:0.05] [N/A:N/A] Post Debridement Volume: (cm) [1:callous to periwound.] [N/A:N/A] Assessment Notes: [1:Debridement] [N/A:N/A] Treatment Notes Wound #1 (Toe Great) Wound Laterality: Plantar, Right Cleanser Peri-Wound Care Topical Primary Dressing KerraCel Ag Gelling Fiber Dressing, 2x2 in (silver alginate) Discharge Instruction: Apply silver alginate to wound bed , tuck into hole Secondary Dressing Woven Gauze Sponges 2x2 in Discharge Instruction: Apply over primary dressing as directed. Optifoam Non-Adhesive Dressing, 4x4 in Discharge Instruction: Apply over primary dressing cut to form donut to help offload Secured With Conforming Stretch Gauze Bandage, Sterile 2x75 (in/in) Discharge Instruction: Secure with stretch gauze as directed. Paper Tape, 2x10 (in/yd) Discharge Instruction: Secure dressing with tape as directed. Compression Wrap Compression Stockings Add-Ons Notes x-ray prescription provided to patient. Electronic Signature(s) Signed: 09/21/2020 5:50:31 PM By: Kalman Shan DO Signed: 09/21/2020 6:28:10 PM By: Baruch Gouty RN, BSN Entered By: Kalman Shan on 09/21/2020 17:45:15 -------------------------------------------------------------------------------- Multi-Disciplinary Care Plan Details Patient Name: Date of Service: [name], [name] 09/21/2020 2:45 PM Medical Record Number: [id number] Patient Account Number: [id number] Date of Birth/Sex: Treating RN: 1951-04-16 (70 y.o. Ricky Lloyd Primary Care Provider: Berlin Hun Other Clinician: Referring Provider: Treating Provider/Extender: Feliz Beam in Treatment: 5 Multidisciplinary Care Plan reviewed with physician Active Inactive Nutrition Nursing Diagnoses: Impaired glucose control: actual or potential Potential for alteratiion in  Nutrition/Potential for imbalanced nutrition Goals: Patient/caregiver will maintain therapeutic glucose control Date Initiated: 08/11/2020 Target Resolution Date: 10/06/2020 Goal Status: Active Interventions: Assess HgA1c results as ordered upon admission and as needed Assess patient nutrition upon admission and as needed per policy Provide education on elevated blood sugars and impact on wound healing Treatment Activities: Patient referred to Primary Care Physician for further nutritional evaluation : 08/11/2020 Notes: Wound/Skin Impairment Nursing Diagnoses: Impaired tissue integrity Knowledge deficit related to ulceration/compromised skin integrity Goals: Patient/caregiver will verbalize understanding of skin care regimen Date Initiated: 08/11/2020 Target Resolution Date: 10/06/2020 Goal Status: Active Ulcer/skin breakdown will have a volume reduction of 30% by week 4 Date Initiated: 08/11/2020 Date Inactivated: 09/08/2020 Target Resolution Date: 09/08/2020 Goal Status: Met Ulcer/skin breakdown will have a volume reduction of 50% by week 8 Date Initiated: 09/08/2020 Target Resolution Date: 10/06/2020 Goal Status: Active Interventions: Assess patient/caregiver ability to obtain necessary supplies Assess patient/caregiver ability to perform ulcer/skin care regimen upon admission and as needed Assess ulceration(s) every visit Provide education on ulcer and skin care Treatment Activities: Skin care regimen initiated : 08/11/2020 Topical wound management initiated : 08/11/2020 Notes: Electronic Signature(s) Signed: 09/21/2020 6:28:10 PM By: Baruch Gouty RN, BSN Entered By: Baruch Gouty on 09/21/2020 16:38:36 -------------------------------------------------------------------------------- Pain Assessment Details Patient Name: Date  of Service: [name], [name] 09/21/2020 2:45 PM Medical Record Number: [id number] Patient Account Number: [id number] Date of Birth/Sex: Treating  RN: 24-Feb-1951 (70 y.o. Ricky Lloyd Primary Care Deshauna Cayson: Berlin Hun Other Clinician: Referring Rapheal Masso: Treating Wyatte Dames/Extender: Feliz Beam in Treatment: 5 Active Problems Location of Pain Severity and Description of Pain Patient Has Paino No Site Locations Rate the pain. Current Pain Level: 0 Pain Management and Medication Current Pain Management: Medication: No Cold Application: No Rest: No Massage: No Activity: No T.E.N.S.: No Heat Application: No Leg drop or elevation: No Is the Current Pain Management Adequate: Adequate How does your wound impact your activities of daily livingo Sleep: No Bathing: No Appetite: No Relationship With Others: No Bladder Continence: No Emotions: No Bowel Continence: No Work: No Toileting: No Drive: No Dressing: No Hobbies: No Electronic Signature(s) Signed: 09/21/2020 6:08:37 PM By: Deon Pilling Entered By: Deon Pilling on 09/21/2020 15:48:41 -------------------------------------------------------------------------------- Patient/Caregiver Education Details Patient Name: Date of Service: [name], [name] 4/26/2022andnbsp2:45 PM Medical Record Number: [id number] Patient Account Number: [id number] Date of Birth/Gender: Treating RN: 12/03/1950 (70 y.o. Ricky Lloyd Primary Care Physician: Berlin Hun Other Clinician: Referring Physician: Treating Physician/Extender: Feliz Beam in Treatment: 5 Education Assessment Education Provided To: Patient Education Topics Provided Elevated Blood Sugar/ Impact on Healing: Methods: Explain/Verbal Responses: Reinforcements needed, State content correctly Offloading: Methods: Explain/Verbal Responses: Reinforcements needed, State content correctly Wound/Skin Impairment: Methods: Explain/Verbal Responses: Reinforcements needed, State content correctly Electronic Signature(s) Signed: 09/21/2020  6:28:10 PM By: Baruch Gouty RN, BSN Entered By: Baruch Gouty on 09/21/2020 16:39:14 -------------------------------------------------------------------------------- Wound Assessment Details Patient Name: Date of Service: [name], [name] 09/21/2020 2:45 PM Medical Record Number: [id number] Patient Account Number: [id number] Date of Birth/Sex: Treating RN: March 23, 1951 (70 y.o. Ricky Lloyd Primary Care Myishia Kasik: Berlin Hun Other Clinician: Referring Graysyn Bache: Treating Malyna Budney/Extender: Darleen Crocker Weeks in Treatment: 5 Wound Status Wound Number: 1 Primary Diabetic Wound/Ulcer of the Lower Extremity Etiology: Wound Location: Right, Plantar T Great oe Wound Open Wounding Event: Trauma Status: Date Acquired: 12/27/2017 Comorbid Lymphedema, Sleep Apnea, Arrhythmia, Congestive Heart Failure, Weeks Of Treatment: 5 History: Coronary Artery Disease, Hypertension, Type II Diabetes, Clustered Wound: No Osteoarthritis, Neuropathy Photos Wound Measurements Length: (cm) 0.4 Width: (cm) 0.4 Depth: (cm) 0.4 Area: (cm) 0.126 Volume: (cm) 0.05 % Reduction in Area: 35.7% % Reduction in Volume: 49% Epithelialization: Medium (34-66%) Tunneling: No Undermining: No Wound Description Classification: Grade 2 Wound Margin: Well defined, not attached Exudate Amount: Medium Exudate Type: Serosanguineous Exudate Color: red, brown Foul Odor After Cleansing: No Slough/Fibrino No Wound Bed Granulation Amount: Large (67-100%) Exposed Structure Granulation Quality: Red, Pink Fascia Exposed: No Necrotic Amount: None Present (0%) Fat Layer (Subcutaneous Tissue) Exposed: Yes Tendon Exposed: No Muscle Exposed: No Joint Exposed: No Bone Exposed: No Assessment Notes callous to periwound. Treatment Notes Wound #1 (Toe Great) Wound Laterality: Plantar, Right Cleanser Peri-Wound Care Topical Primary Dressing KerraCel Ag Gelling Fiber Dressing, 2x2 in  (silver alginate) Discharge Instruction: Apply silver alginate to wound bed , tuck into hole Secondary Dressing Woven Gauze Sponges 2x2 in Discharge Instruction: Apply over primary dressing as directed. Optifoam Non-Adhesive Dressing, 4x4 in Discharge Instruction: Apply over primary dressing cut to form donut to help offload Secured With Conforming Stretch Gauze Bandage, Sterile 2x75 (in/in) Discharge Instruction: Secure with stretch gauze as directed. Paper Tape, 2x10 (in/yd) Discharge Instruction: Secure dressing with tape as directed. Compression Wrap Compression  Stockings Add-Ons Notes x-ray prescription provided to patient. Electronic Signature(s) Signed: 09/21/2020 5:14:41 PM By: Sandre Kitty Signed: 09/21/2020 6:08:37 PM By: Deon Pilling Entered By: Sandre Kitty on 09/21/2020 16:05:54 -------------------------------------------------------------------------------- Vitals Details Patient Name: Date of Service: [name], [name] 09/21/2020 2:45 PM Medical Record Number: [id number] Patient Account Number: [id number] Date of Birth/Sex: Treating RN: 1950-09-24 (70 y.o. Ricky Lloyd Primary Care Kavaughn Faucett: Berlin Hun Other Clinician: Referring Hasana Alcorta: Treating Abbee Cremeens/Extender: Darleen Crocker Weeks in Treatment: 5 Vital Signs Time Taken: 15:45 Temperature (F): 98.4 Weight (lbs): 390 Pulse (bpm): 76 Respiratory Rate (breaths/min): 18 Blood Pressure (mmHg): 112/73 Capillary Blood Glucose (mg/dl): 115 Reference Range: 80 - 120 mg / dl Electronic Signature(s) Signed: 09/21/2020 6:08:37 PM By: Deon Pilling Entered By: Deon Pilling on 09/21/2020 15:48:31

## 2020-09-21 NOTE — Progress Notes (Signed)
EDREES, VALENT ([id number]) Visit Report for 09/21/2020 Chief Complaint Document Details Patient Name: Date of Service: [name], [name] 09/21/2020 2:45 PM Medical Record Number: [id number] Patient Account Number: [id number] Date of Birth/Sex: Treating RN: 1950/08/07 (70 y.o. [name] Primary Care Provider: Cephus Richer Other Clinician: Referring Provider: Treating Provider/Extender: Marrian Salvage in Treatment: 5 Information Obtained from: Patient Chief Complaint Right 1st T Ulcer oe Electronic Signature(s) Signed: 09/21/2020 5:50:31 PM By: Geralyn Corwin DO Entered By: Geralyn Corwin on 09/21/2020 17:45:22 -------------------------------------------------------------------------------- Debridement Details Patient Name: Date of Service: [name], [name] 09/21/2020 2:45 PM Medical Record Number: [id number] Patient Account Number: [id number] Date of Birth/Sex: Treating RN: 22-Feb-1951 (69 y.o. [name] Primary Care Provider: Cephus Richer Other Clinician: Referring Provider: Treating Provider/Extender: Marrian Salvage in Treatment: 5 Debridement Performed for Assessment: Wound #1 Right,Plantar T Great oe Performed By: Physician Geralyn Corwin, DO Debridement Type: Debridement Severity of Tissue Pre Debridement: Fat layer exposed Level of Consciousness (Pre-procedure): Awake and Alert Pre-procedure Verification/Time Out Yes - 14:35 Taken: Start Time: 14:36 Pain Control: Lidocaine 4% T opical Solution T Area Debrided (L x W): otal 0.5 (cm) x 0.5 (cm) = 0.25 (cm) Tissue and other material debrided: Viable, Non-Viable, Callus, Slough, Subcutaneous, Skin: Epidermis, Slough Level: Skin/Subcutaneous Tissue Debridement Description: Excisional Instrument: Curette Bleeding: Minimum Hemostasis Achieved: Pressure End Time: 16:40 Procedural Pain: 0 Post Procedural Pain: 0 Response to  Treatment: Procedure was tolerated well Level of Consciousness (Post- Awake and Alert procedure): Post Debridement Measurements of Total Wound Length: (cm) 0.4 Width: (cm) 0.4 Depth: (cm) 0.4 Volume: (cm) 0.05 Character of Wound/Ulcer Post Debridement: Improved Severity of Tissue Post Debridement: Fat layer exposed Post Procedure Diagnosis Same as Pre-procedure Electronic Signature(s) Signed: 09/21/2020 5:50:31 PM By: Geralyn Corwin DO Signed: 09/21/2020 6:28:10 PM By: Zenaida Deed RN, BSN Entered By: Zenaida Deed on 09/21/2020 16:41:47 -------------------------------------------------------------------------------- HPI Details Patient Name: Date of Service: [name], [name] 09/21/2020 2:45 PM Medical Record Number: [id number] Patient Account Number: [id number] Date of Birth/Sex: Treating RN: 1951-02-10 (70 y.o. [name] Primary Care Provider: Cephus Richer Other Clinician: Referring Provider: Treating Provider/Extender: Marrian Salvage in Treatment: 5 History of Present Illness HPI Description: 08/11/2020 upon evaluation today patient presents for initial inspection here in our clinic concerning issues he has been having with wounds over the plantar aspect of his great toe on the right which has been present he tells me since 2019. At that time he was cleaning his pool and scraped his toes on the bottom of the pool. 3 of the 4 toes healed without complication this wound however has never closed. He was previously seen at the wound care center in [place]. He tells me that he is used multiple medications included Hydrofera Blue, Aquacel, and unfortunately many other things none of which were ever completely effective. Has not been placed in a total contact cast in fact he did not even know what that was. He does tell me that Unna boots were used at one time as well as other compression wraps to help with leg ulcerations he does have  lymphedema but right now his legs seem to be doing quite well. His ABI is 1.02 today. He does have a history of an A1c 9.06 June 2020. Currently he has been utilizing Aquacel. He tells me he is out of that however. Again he does have diabetes mellitus type 2, lymphedema,  congestive heart failure, and coronary artery disease. 08/18/2020 upon evaluation today patient appears to be doing well with regard to his toe ulceration. Fortunately there is just a little bit of callus buildup here and I think that we can manage that quite nicely with appropriate dressing changes here. Fortunately there is no signs of active infection at this time. No fevers, chills, nausea, vomiting, or diarrhea. Unfortunately after I saw him last he ended up over the next 24 hours developing an issue with his right lower extremity further up around the knee and into the thigh region as well as the calf where he had a significant cellulitis completely unrelated to the toe. Nonetheless he did require antibiotics he was given Bactrim fortunately that seems to be doing significantly better which is great news he did show me the pictures. He also had an x-ray performed at his pain clinic and I did review that personally today it was actually an image that he brought me which was fairly clear I saw no evidence of obvious bony destruction he did have some obvious signs of arthritis but again nothing that I think is definitive for osteomyelitis at this point all this was discussed with the patient today as well. 08/25/2020 upon evaluation today patient's wound actually showed signs of fairly good granulation and epithelization at this point. He does have some callus around the edges of the wound and unfortunately he does have some slough on the surface of the wound as well. This is going require sharp debridement. There is no signs of infection. 09/08/2020 upon evaluation today patient appears to be doing well with regard to her wound. He  has been tolerating the dressing changes without complication. Fortunately there is no signs of active infection at this time. No fevers, chills, nausea, vomiting, or diarrhea. With that being said he still continues developed callus unfortunately which is causing his main issue here. 09/16/2020 patient presents today for 1 week follow-up of his right great toe diabetic foot ulcer. He would like to have a total contact cast placed. He has been using silver alginate to the wound every other day without issues. He denies any fever/chills or purulent drainage. He overall feels well 4/26; patient presents today for close follow-up of his initial cast placement. He states he overall tolerated it well. He has no complaints today. He did mention that this is a wound that has been going on for 3 years and has not improved. Electronic Signature(s) Signed: 09/21/2020 5:50:31 PM By: Geralyn Corwin DO Entered By: Geralyn Corwin on 09/21/2020 17:46:23 -------------------------------------------------------------------------------- Physical Exam Details Patient Name: Date of Service: [name], [name] 09/21/2020 2:45 PM Medical Record Number: [id number] Patient Account Number: [id number] Date of Birth/Sex: Treating RN: December 08, 1950 (70 y.o. [name] Primary Care Provider: Cephus Richer Other Clinician: Referring Provider: Treating Provider/Extender: Osie Bond Weeks in Treatment: 5 Constitutional respirations regular, non-labored and within target range for patient.. Cardiovascular 2+ femoral pulses. Psychiatric pleasant and cooperative. Notes Right great toe: Callus circumferentially to the wound bed. There is granulation tissue present to the wound bed. The callus was debrided. No signs of infection. When probing I do not think I hit bone however I am very close to it. Electronic Signature(s) Signed: 09/21/2020 5:50:31 PM By: Geralyn Corwin DO Entered By:  Geralyn Corwin on 09/21/2020 17:47:18 -------------------------------------------------------------------------------- Physician Orders Details Patient Name: Date of Service: [name], [name] 09/21/2020 2:45 PM Medical Record Number: [id number] Patient Account  Number: [id number] Date of Birth/Sex: Treating RN: 26-Jul-1950 (70 y.o. [name]) Boehlein, Linda Primary Care Provider: Cephus Richerurner, Miranda Other Clinician: Referring Provider: Treating Provider/Extender: Marrian SalvageHoffman, Shannette Tabares Turner, [name] in Treatment: 5 Verbal / Phone Orders: No Diagnosis Coding ICD-10 Coding Code Description E11.621 Type 2 diabetes mellitus with foot ulcer L97.512 Non-pressure chronic ulcer of other part of right foot with fat layer exposed E11.40 Type 2 diabetes mellitus with diabetic neuropathy, unspecified I89.0 Lymphedema, not elsewhere classified I50.42 Chronic combined systolic (congestive) and diastolic (congestive) heart failure I25.10 Atherosclerotic heart disease of native coronary artery without angina pectoris Follow-up Appointments ppointment in 1 week. - Wed with Leonard SchwartzHoyt Return A Bathing/ Shower/ Hygiene May shower and wash wound with soap and water. - on days when dressing is cahnged Edema Control - Lymphedema / SCD / Other Bilateral Lower Extremities Elevate legs to the level of the heart or above for 30 minutes daily and/or when sitting, a frequency of: - whenever sitting Avoid standing for long periods of time. Patient to wear own compression stockings every day. Exercise regularly Moisturize legs daily. - left leg every night. Wound Treatment Wound #1 - T Great oe Wound Laterality: Plantar, Right Prim Dressing: KerraCel Ag Gelling Fiber Dressing, 2x2 in (silver alginate) (Dispense As Written) Every Other Day/15 Days ary Discharge Instructions: Apply silver alginate to wound bed , tuck into hole Secondary Dressing: Woven Gauze Sponges 2x2 in (Generic) Every Other Day/15 Days Discharge  Instructions: Apply over primary dressing as directed. Secondary Dressing: Optifoam Non-Adhesive Dressing, 4x4 in (Generic) Every Other Day/15 Days Discharge Instructions: Apply over primary dressing cut to form donut to help offload Secured With: Conforming Stretch Gauze Bandage, Sterile 2x75 (in/in) Every Other Day/15 Days Discharge Instructions: Secure with stretch gauze as directed. Secured With: Paper Tape, 2x10 (in/yd) Every Other Day/15 Days Discharge Instructions: Secure dressing with tape as directed. Electronic Signature(s) Signed: 09/21/2020 5:50:31 PM By: Geralyn CorwinHoffman, Sianni Cloninger DO Entered By: Geralyn CorwinHoffman, Lejon Afzal on 09/21/2020 17:47:31 -------------------------------------------------------------------------------- Problem List Details Patient Name: Date of Service: [name], [name] 09/21/2020 2:45 PM Medical Record Number: [id number] Patient Account Number: [id number] Date of Birth/Sex: Treating RN: 26-Jul-1950 (70 y.o. [name]) Boehlein, Linda Primary Care Provider: Cephus Richerurner, Miranda Other Clinician: Referring Provider: Treating Provider/Extender: Marrian SalvageHoffman, Pasqualina Colasurdo Turner, [name] in Treatment: 5 Active Problems ICD-10 Encounter Code Description Active Date MDM Diagnosis E11.621 Type 2 diabetes mellitus with foot ulcer 08/11/2020 No Yes L97.512 Non-pressure chronic ulcer of other part of right foot with fat layer exposed 08/11/2020 No Yes E11.40 Type 2 diabetes mellitus with diabetic neuropathy, unspecified 08/11/2020 No Yes I89.0 Lymphedema, not elsewhere classified 08/11/2020 No Yes I50.42 Chronic combined systolic (congestive) and diastolic (congestive) heart failure 08/11/2020 No Yes I25.10 Atherosclerotic heart disease of native coronary artery without angina pectoris 08/11/2020 No Yes Inactive Problems Resolved Problems Electronic Signature(s) Signed: 09/21/2020 5:50:31 PM By: Geralyn CorwinHoffman, Pandora Mccrackin DO Entered By: Geralyn CorwinHoffman, Deandrew Hoecker on 09/21/2020  17:45:05 -------------------------------------------------------------------------------- Progress Note Details Patient Name: Date of Service: [name], [name] 09/21/2020 2:45 PM Medical Record Number: [id number] Patient Account Number: [id number] Date of Birth/Sex: Treating RN: 26-Jul-1950 (70 y.o. [name]) Boehlein, Linda Primary Care Provider: Cephus Richerurner, Miranda Other Clinician: Referring Provider: Treating Provider/Extender: Marrian SalvageHoffman, Jaelynne Hockley Turner, [name] in Treatment: 5 Subjective Chief Complaint Information obtained from Patient Right 1st T Ulcer oe History of Present Illness (HPI) 08/11/2020 upon evaluation today patient presents for initial inspection here in our clinic concerning issues he has been having with wounds over the plantar aspect of his  great toe on the right which has been present he tells me since 2019. At that time he was cleaning his pool and scraped his toes on the bottom of the pool. 3 of the 4 toes healed without complication this wound however has never closed. He was previously seen at the wound care center in [place]. He tells me that he is used multiple medications included Hydrofera Blue, Aquacel, and unfortunately many other things none of which were ever completely effective. Has not been placed in a total contact cast in fact he did not even know what that was. He does tell me that Unna boots were used at one time as well as other compression wraps to help with leg ulcerations he does have lymphedema but right now his legs seem to be doing quite well. His ABI is 1.02 today. He does have a history of an A1c 9.06 June 2020. Currently he has been utilizing Aquacel. He tells me he is out of that however. Again he does have diabetes mellitus type 2, lymphedema, congestive heart failure, and coronary artery disease. 08/18/2020 upon evaluation today patient appears to be doing well with regard to his toe ulceration. Fortunately there is just a little bit of  callus buildup here and I think that we can manage that quite nicely with appropriate dressing changes here. Fortunately there is no signs of active infection at this time. No fevers, chills, nausea, vomiting, or diarrhea. Unfortunately after I saw him last he ended up over the next 24 hours developing an issue with his right lower extremity further up around the knee and into the thigh region as well as the calf where he had a significant cellulitis completely unrelated to the toe. Nonetheless he did require antibiotics he was given Bactrim fortunately that seems to be doing significantly better which is great news he did show me the pictures. He also had an x-ray performed at his pain clinic and I did review that personally today it was actually an image that he brought me which was fairly clear I saw no evidence of obvious bony destruction he did have some obvious signs of arthritis but again nothing that I think is definitive for osteomyelitis at this point all this was discussed with the patient today as well. 08/25/2020 upon evaluation today patient's wound actually showed signs of fairly good granulation and epithelization at this point. He does have some callus around the edges of the wound and unfortunately he does have some slough on the surface of the wound as well. This is going require sharp debridement. There is no signs of infection. 09/08/2020 upon evaluation today patient appears to be doing well with regard to her wound. He has been tolerating the dressing changes without complication. Fortunately there is no signs of active infection at this time. No fevers, chills, nausea, vomiting, or diarrhea. With that being said he still continues developed callus unfortunately which is causing his main issue here. 09/16/2020 patient presents today for 1 week follow-up of his right great toe diabetic foot ulcer. He would like to have a total contact cast placed. He has been using silver alginate  to the wound every other day without issues. He denies any fever/chills or purulent drainage. He overall feels well 4/26; patient presents today for close follow-up of his initial cast placement. He states he overall tolerated it well. He has no complaints today. He did mention that this is a wound that has been going on for 3 years and has  not improved. Patient History Information obtained from Patient. Family History Unknown History. Social History Never smoker, Marital Status - Married, Alcohol Use - Never, Drug Use - No History, Caffeine Use - Rarely. 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 Vitals Time Taken: 3:45 PM, Weight: 390 lbs, Temperature: 98.4 F, Pulse: 76 bpm, Respiratory Rate: 18 breaths/min, Blood Pressure: 112/73 mmHg, Capillary Blood Glucose: 115 mg/dl. Cardiovascular 2+ femoral pulses. Psychiatric pleasant and cooperative. General Notes: Right great toe: Callus circumferentially to the wound bed. There is granulation tissue present to the wound bed. The callus was debrided. No signs of infection. When probing I do not think I hit bone however I am very close to it. Integumentary (Hair, Skin) Wound #1 status is Open. Original cause of wound was Trauma. The date acquired was: 12/27/2017. The wound has been in treatment 5 weeks. The wound is located on the [employer]. The wound measures 0.4cm length x 0.4cm width x 0.4cm depth; 0.126cm^2 area and  0.05cm^3 volume. There is Fat oe Layer (Subcutaneous Tissue) exposed. There is no tunneling or undermining noted. There is a medium amount of serosanguineous drainage noted. The wound margin is well defined and not attached to the wound base. There is large (67-100%) red, pink granulation within the wound bed. There is no necrotic tissue within the wound bed. General Notes: callous to periwound. Assessment Active Problems ICD-10 Type 2 diabetes mellitus with foot ulcer Non-pressure chronic ulcer of other part of right foot with fat layer exposed Type 2 diabetes mellitus with diabetic neuropathy, unspecified Lymphedema, not elsewhere classified Chronic combined systolic (congestive) and diastolic (congestive) heart failure Atherosclerotic heart disease of native coronary artery without angina pectoris Patient's wound is stable in appearance and size since last clinic visit. He is here for cast change. I am concerned based on the history that this has been going on for 3 years. I see no imaging on file and would like to obtain an x-ray. If everything looks good he can have the cast replaced next Wednesday. No acute signs of infection today. Procedures Wound #1 Pre-procedure diagnosis of Wound #1 is a Diabetic Wound/Ulcer of the Lower Extremity located on the Right,Plantar T Great .Severity of Tissue Pre oe Debridement is: Fat layer exposed. There was a Excisional Skin/Subcutaneous Tissue Debridement with a total area of 0.25 sq cm performed by Geralyn Corwin, DO. With the following instrument(s): Curette to remove Viable and Non-Viable tissue/material. Material removed includes Callus, Subcutaneous Tissue, Slough, and Skin: Epidermis after achieving pain control using Lidocaine 4% Topical Solution. No specimens were taken. A time out was conducted at 14:35, prior to the start of the procedure. A Minimum amount of bleeding was controlled with Pressure. The procedure was tolerated well with a  pain level of 0 throughout and a pain level of 0 following the procedure. Post Debridement Measurements: 0.4cm length x 0.4cm width x 0.4cm depth; 0.05cm^3 volume. Character of Wound/Ulcer Post Debridement is improved. Severity of Tissue Post Debridement is: Fat layer exposed. Post procedure Diagnosis Wound #1: Same as Pre-Procedure Plan Follow-up Appointments: Return Appointment in 1 week. - Wed  with Luana Shu Shower/ Hygiene: May shower and wash wound with soap and water. - on days when dressing is cahnged Edema Control - Lymphedema / SCD / Other: Elevate legs to the level of the heart or above for 30 minutes daily and/or when sitting, a frequency of: - whenever sitting Avoid standing for long periods of time. Patient to wear own compression stockings every day. Exercise regularly Moisturize legs daily. - left leg every night. WOUND #1: - T Great Wound Laterality: Plantar, Right oe Prim Dressing: KerraCel Ag Gelling Fiber Dressing, 2x2 in (silver alginate) (Dispense As Written) Every Other Day/15 Days ary Discharge Instructions: Apply silver alginate to wound bed , tuck into hole Secondary Dressing: Woven Gauze Sponges 2x2 in (Generic) Every Other Day/15 Days Discharge Instructions: Apply over primary dressing as directed. Secondary Dressing: Optifoam Non-Adhesive Dressing, 4x4 in (Generic) Every Other Day/15 Days Discharge Instructions: Apply over primary dressing cut to form donut to help offload Secured With: Conforming Stretch Gauze Bandage, Sterile 2x75 (in/in) Every Other Day/15 Days Discharge Instructions: Secure with stretch gauze as directed. Secured With: Paper Tape, 2x10 (in/yd) Every Other Day/15 Days Discharge Instructions: Secure dressing with tape as directed. 1. X-ray of right foot 2. Follow-up in 1 week Electronic Signature(s) Signed: 09/21/2020 5:50:31 PM By: Geralyn Corwin DO Entered By: Geralyn Corwin on 09/21/2020  17:49:10 -------------------------------------------------------------------------------- HxROS Details Patient Name: Date of Service: [name], [name] 09/21/2020 2:45 PM Medical Record Number: [id number] Patient Account Number: [id number] Date of Birth/Sex: Treating RN: January 16, 1951 (69 y.o. [name] Primary Care Provider: Cephus Richer Other Clinician: Referring Provider: Treating Provider/Extender: Marrian Salvage in Treatment: 5 Information Obtained From Patient Hematologic/Lymphatic Medical History: Positive for: Lymphedema - lower legs Respiratory Medical History: Positive for: Sleep Apnea - CPAP Cardiovascular Medical History: Positive for: Arrhythmia - A-fib; Congestive Heart Failure; Coronary Artery Disease - s/p CABG w/5 stents; Hypertension Endocrine Medical History: Positive for: Type II Diabetes Time with diabetes: over 10 year Treated with: Insulin Blood sugar tested every day: Yes Tested : 2-3 times a day Musculoskeletal Medical History: Positive for: Osteoarthritis Negative for: Gout Neurologic Medical History: Positive for: Neuropathy Immunizations Pneumococcal Vaccine: Received Pneumococcal Vaccination: Yes Implantable Devices None Family and Social History Unknown History: Yes; Never smoker; Marital Status - Married; Alcohol Use: Never; Drug Use: No History; Caffeine Use: Rarely; Financial Concerns: No; Food, Clothing or Shelter Needs: No; Support System Lacking: No; Transportation Concerns: No Electronic Signature(s) Signed: 09/21/2020 5:50:31 PM By: Geralyn Corwin DO Signed: 09/21/2020 6:28:10 PM By: Zenaida Deed RN, BSN Entered By: Geralyn Corwin on 09/21/2020 17:46:31 -------------------------------------------------------------------------------- SuperBill Details Patient Name: Date of Service: [name], [name] 09/21/2020 Medical Record Number: [id number] Patient Account Number:  [id number] Date of Birth/Sex: Treating RN: 03-02-1951 (70 y.o. [name] Primary Care Provider: Cephus Richer Other Clinician: Referring Provider: Treating Provider/Extender: Marrian Salvage in Treatment: 5 Diagnosis Coding ICD-10 Codes Code Description [phone] Type 2 diabetes mellitus with foot ulcer L97.512 Non-pressure chronic ulcer of other part of right foot with fat layer exposed E11.40 Type 2 diabetes mellitus with diabetic neuropathy, unspecified I89.0 Lymphedema, not elsewhere classified I50.42 Chronic combined systolic (congestive) and diastolic (congestive) heart failure I25.10 Atherosclerotic heart disease of native coronary artery without angina pectoris Facility Procedures CPT4 Code: [id number] Description: 11042 - DEB SUBQ TISSUE 20 SQ CM/< ICD-10 Diagnosis Description L97.512 Non-pressure chronic ulcer of other part of right foot with fat layer exposed Modifier: Quantity: 1 Electronic  Signature(s) Signed: 09/21/2020 5:50:31 PM By: Geralyn Corwin DO Entered By: Geralyn Corwin on 09/21/2020 17:50:04

## 2020-09-21 NOTE — Progress Notes (Signed)
Ricky Lloyd, Ricky Lloyd ([id number]) Visit Report for 09/16/2020 Arrival Information Details Patient Name: Date of Service: [place] N[place] [name] 09/16/2020 2:15 PM Medical Record Number: [id number] Patient Account Number: [id number] Date of Birth/Sex: Treating RN: Nov 30, 1950 (70 y.o. Ricky Lloyd Primary Care Dedrick Heffner: [place] Other Clinician: Referring Kortlynn Poust: Treating Kasean Denherder/Extender: Feliz Beam in Treatment: 5 Visit Information History Since Last Visit Added or deleted any medications: No Patient Arrived: Ambulatory Any new allergies or adverse reactions: No Arrival Time: 14:21 Had a fall or experienced change in No Transfer Assistance: None activities of daily living that may affect Patient Identification Verified: Yes risk of falls: Secondary Verification Process Completed: Yes Signs or symptoms of abuse/neglect since last visito No Patient Requires Transmission-Based Precautions: No Hospitalized since last visit: No Patient Has Alerts: No Implantable device outside of the clinic excluding No cellular tissue based products placed in the center since last visit: Has Dressing in Place as Prescribed: Yes Pain Present Now: Yes Electronic Signature(s) Signed: 09/16/2020 5:08:10 PM By: Lorrin Jackson Entered By: Lorrin Jackson on 09/16/2020 14:26:06 -------------------------------------------------------------------------------- Encounter Discharge Information Details Patient Name: Date of Service: [name], [name] 09/16/2020 2:15 PM Medical Record Number: [id number] Patient Account Number: [id number] Date of Birth/Sex: Treating RN: 1950-08-25 (69 y.o. Ricky Lloyd Primary Care Kynlie Jane: [place] Other Clinician: Referring Shanera Meske: Treating Taiylor Virden/Extender: Feliz Beam in Treatment: 5 Encounter Discharge Information Items Post Procedure Vitals Discharge Condition: Stable Temperature  (F): 98.0 Ambulatory Status: Ambulatory Pulse (bpm): 79 Discharge Destination: Home Respiratory Rate (breaths/min): 18 Transportation: Private Auto Blood Pressure (mmHg): 106/59 Accompanied By: self Schedule Follow-up Appointment: Yes Clinical Summary of Care: Patient Declined Electronic Signature(s) Signed: 09/17/2020 5:10:31 PM By: Baruch Gouty RN, BSN Entered By: Baruch Gouty on 09/16/2020 16:50:20 -------------------------------------------------------------------------------- Lower Extremity Assessment Details Patient Name: Date of Service: [name], [name] 09/16/2020 2:15 PM Medical Record Number: [id number] Patient Account Number: [id number] Date of Birth/Sex: Treating RN: Sep 21, 1950 (70 y.o. Ricky Lloyd Primary Care [name]: [place] Other Clinician: Referring Elloise Roark: Treating Necola Bluestein/Extender: Ricky Lloyd in Treatment: 5 Edema Assessment Assessed: [Left: No] [Right: Yes] Edema: [Left: Ye] [Right: s] Calf Left: Right: Point of Measurement: 33 cm From Medial Instep 54 cm Ankle Left: Right: Point of Measurement: 16 cm From Medial Instep 31 cm Vascular Assessment Pulses: Dorsalis Pedis Palpable: [Right:Yes] Electronic Signature(s) Signed: 09/16/2020 5:08:10 PM By: Lorrin Jackson Entered By: Lorrin Jackson on 09/16/2020 14:30:21 -------------------------------------------------------------------------------- Multi Wound Chart Details Patient Name: Date of Service: [name], [name] 09/16/2020 2:15 PM Medical Record Number: [id number] Patient Account Number: [id number] Date of Birth/Sex: Treating RN: 17-Feb-1951 (69 y.o. Ricky Lloyd Primary Care [name]: [place] Other Clinician: Referring Haya Hemler: Treating Alexsandra Shontz/Extender: Feliz Beam in Treatment: 5 Vital Signs Height(in): Pulse(bpm): 102 Weight(lbs): 390 Blood Pressure(mmHg): 106/59 Body Mass  Index(BMI): Temperature(F): 98.0 Respiratory Rate(breaths/min): 20 Photos: [1:No Photos Right, Plantar T Great oe] [N/A:N/A N/A] Wound Location: [1:Trauma] [N/A:N/A] Wounding Event: [1:Diabetic Wound/Ulcer of the Lower] [N/A:N/A] Primary Etiology: [1:Extremity Lymphedema, Sleep Apnea,] [N/A:N/A] Comorbid History: [1:Arrhythmia, Congestive Heart Failure, Coronary Artery Disease, Hypertension, Type II Diabetes, Osteoarthritis, Neuropathy 12/27/2017] [N/A:N/A] Date Acquired: [1:5] [N/A:N/A] Lloyd of Treatment: [1:Open] [N/A:N/A] Wound Status: [1:0.3x0.3x0.2] [N/A:N/A] Measurements L x W x D (cm) [1:0.071] [N/A:N/A] A (cm) : rea [1:0.014] [N/A:N/A] Volume (cm) : [1:63.80%] [N/A:N/A] % Reduction in A rea: [1:85.70%] [N/A:N/A] % Reduction in Volume: [1:Grade 2] [N/A:N/A]  Classification: [1:Medium] [N/A:N/A] Exudate A mount: [1:Serosanguineous] [N/A:N/A] Exudate Type: [1:red, brown] [N/A:N/A] Exudate Color: [1:Well defined, not attached] [N/A:N/A] Wound Margin: [1:Large (67-100%)] [N/A:N/A] Granulation A mount: [1:Red, Pink] [N/A:N/A] Granulation Quality: [1:None Present (0%)] [N/A:N/A] Necrotic A mount: [1:Fat Layer (Subcutaneous Tissue): Yes N/A] Exposed Structures: [1:Fascia: No Tendon: No Muscle: No Joint: No Bone: No Medium (34-66%)] [N/A:N/A] Epithelialization: [1:Debridement - Excisional] [N/A:N/A] Debridement: Pre-procedure Verification/Time Out 15:10 [N/A:N/A] Taken: [1:Lidocaine 4% Topical Solution] [N/A:N/A] Pain Control: [1:Callus, Subcutaneous] [N/A:N/A] Tissue Debrided: [1:Skin/Subcutaneous Tissue] [N/A:N/A] Level: [1:0.25] [N/A:N/A] Debridement A (sq cm): [1:rea Curette] [N/A:N/A] Instrument: [1:Minimum] [N/A:N/A] Bleeding: [1:Pressure] [N/A:N/A] Hemostasis A chieved: [1:0] [N/A:N/A] Procedural Pain: [1:0] [N/A:N/A] Post Procedural Pain: [1:Procedure was tolerated well] [N/A:N/A] Debridement Treatment Response: [1:0.3x0.3x0.2] [N/A:N/A] Post Debridement  Measurements L x W x D (cm) [1:0.014] [N/A:N/A] Post Debridement Volume: (cm) [1:Calloused periwound] [N/A:N/A] Assessment Notes: [1:Debridement] [N/A:N/A] Procedures Performed: [1:T Contact Cast otal] Treatment Notes Wound #1 (Toe Great) Wound Laterality: Plantar, Right Cleanser Peri-Wound Care Topical Primary Dressing KerraCel Ag Gelling Fiber Dressing, 2x2 in (silver alginate) Discharge Instruction: Apply silver alginate to wound bed as instructed Secondary Dressing Woven Gauze Sponges 2x2 in Discharge Instruction: Apply over primary dressing as directed. Optifoam Non-Adhesive Dressing, 4x4 in Discharge Instruction: Apply over primary dressing cut to form donut to help offload Secured With Compression Wrap Compression Stockings Add-Ons Electronic Signature(s) Signed: 09/16/2020 5:04:44 PM By: Kalman Shan DO Signed: 09/16/2020 5:27:07 PM By: Deon Pilling Entered By: Kalman Shan on 09/16/2020 16:56:10 -------------------------------------------------------------------------------- Multi-Disciplinary Care Plan Details Patient Name: Date of Service: [name], [name] 09/16/2020 2:15 PM Medical Record Number: [id number] Patient Account Number: [id number] Date of Birth/Sex: Treating RN: 1950/07/24 (70 y.o. Ricky Lloyd Primary Care Analy Bassford: [place] Other Clinician: Referring Karyl Sharrar: Treating Ladeidra Borys/Extender: Feliz Beam in Treatment: 5 Multidisciplinary Care Plan reviewed with physician Active Inactive Nutrition Nursing Diagnoses: Impaired glucose control: actual or potential Potential for alteratiion in Nutrition/Potential for imbalanced nutrition Goals: Patient/caregiver will maintain therapeutic glucose control Date Initiated: 08/11/2020 Target Resolution Date: 10/06/2020 Goal Status: Active Interventions: Assess HgA1c results as ordered upon admission and as needed Assess patient nutrition upon admission and  as needed per policy Provide education on elevated blood sugars and impact on wound healing Treatment Activities: Patient referred to Primary Care Physician for further nutritional evaluation : 08/11/2020 Notes: Wound/Skin Impairment Nursing Diagnoses: Impaired tissue integrity Knowledge deficit related to ulceration/compromised skin integrity Goals: Patient/caregiver will verbalize understanding of skin care regimen Date Initiated: 08/11/2020 Target Resolution Date: 10/06/2020 Goal Status: Active Ulcer/skin breakdown will have a volume reduction of 30% by week 4 Date Initiated: 08/11/2020 Date Inactivated: 09/08/2020 Target Resolution Date: 09/08/2020 Goal Status: Met Ulcer/skin breakdown will have a volume reduction of 50% by week 8 Date Initiated: 09/08/2020 Target Resolution Date: 10/06/2020 Goal Status: Active Interventions: Assess patient/caregiver ability to obtain necessary supplies Assess patient/caregiver ability to perform ulcer/skin care regimen upon admission and as needed Assess ulceration(s) every visit Provide education on ulcer and skin care Treatment Activities: Skin care regimen initiated : 08/11/2020 Topical wound management initiated : 08/11/2020 Notes: Electronic Signature(s) Signed: 09/16/2020 5:27:07 PM By: Deon Pilling Entered By: Deon Pilling on 09/16/2020 15:14:43 -------------------------------------------------------------------------------- Pain Assessment Details Patient Name: Date of Service: [name], [name] 09/16/2020 2:15 PM Medical Record Number: [id number] Patient Account Number: [id number] Date of Birth/Sex: Treating RN: December 30, 1950 (70 y.o. Ricky Lloyd Primary Care Kenya Kook: [place] Other Clinician: Referring Shenekia Riess: Treating Madalena Kesecker/Extender: Feliz Beam in  Treatment: 5 Active Problems Location of Pain Severity and Description of Pain Patient Has Paino Yes Site Locations Pain  Location: Generalized Pain, Pain in Ulcers With Dressing Change: Yes Duration of the Pain. Constant / Intermittento Intermittent Rate the pain. Current Pain Level: 3 Character of Pain Describe the Pain: Burning, Tender Pain Management and Medication Current Pain Management: Medication: Yes Cold Application: No Rest: Yes Massage: No Activity: No T.E.N.S.: No Heat Application: No Leg drop or elevation: No Is the Current Pain Management Adequate: Inadequate How does your wound impact your activities of daily livingo Sleep: No Bathing: No Appetite: No Relationship With Others: No Bladder Continence: No Emotions: No Bowel Continence: No Work: No Toileting: No Drive: No Dressing: No Hobbies: No Electronic Signature(s) Signed: 09/16/2020 5:08:10 PM By: Lorrin Jackson Entered By: Lorrin Jackson on 09/16/2020 14:27:24 -------------------------------------------------------------------------------- Patient/Caregiver Education Details Patient Name: Date of Service: [name], [name] 4/21/2022andnbsp2:15 PM Medical Record Number: [id number] Patient Account Number: [id number] Date of Birth/Gender: Treating RN: [birth year] (70 y.o. Ricky Lloyd Primary Care Physician: [place] Other Clinician: Referring Physician: Treating Physician/Extender: Feliz Beam in Treatment: 5 Education Assessment Education Provided To: Patient Education Topics Provided Elevated Blood Sugar/ Impact on Healing: Handouts: Elevated Blood Sugars: How Do They Affect Wound Healing Methods: Explain/Verbal Responses: Reinforcements needed Electronic Signature(s) Signed: 09/16/2020 5:27:07 PM By: Deon Pilling Entered By: Deon Pilling on 09/16/2020 15:15:15 -------------------------------------------------------------------------------- Wound Assessment Details Patient Name: Date of Service: [name], [name] 09/16/2020 2:15 PM Medical Record Number:  [id number] Patient Account Number: [id number] Date of Birth/Sex: Treating RN: [birth year] (70 y.o. Ricky Lloyd Primary Care Erla Bacchi: [place] Other Clinician: Referring Waldon Sheerin: Treating Shrey Boike/Extender: Feliz Beam in Treatment: 5 Wound Status Wound Number: 1 Primary Diabetic Wound/Ulcer of the Lower Extremity Etiology: Wound Location: Right, Plantar T Great oe Wound Open Wounding Event: Trauma Status: Date Acquired: 12/27/2017 Comorbid Lymphedema, Sleep Apnea, Arrhythmia, Congestive Heart Failure, Lloyd Of Treatment: 5 History: Coronary Artery Disease, Hypertension, Type II Diabetes, Clustered Wound: No Osteoarthritis, Neuropathy Photos Wound Measurements Length: (cm) 0.3 Width: (cm) 0.3 Depth: (cm) 0.2 Area: (cm) 0.071 Volume: (cm) 0.014 % Reduction in Area: 63.8% % Reduction in Volume: 85.7% Epithelialization: Medium (34-66%) Tunneling: No Undermining: No Wound Description Classification: Grade 2 Wound Margin: Well defined, not attached Exudate Amount: Medium Exudate Type: Serosanguineous Exudate Color: red, brown Wound Bed Granulation Amount: Large (67-100%) Granulation Quality: Red, Pink Necrotic Amount: None Present (0%) Foul Odor After Cleansing: No Slough/Fibrino No Exposed Structure Fascia Exposed: No Fat Layer (Subcutaneous Tissue) Exposed: Yes Tendon Exposed: No Muscle Exposed: No Joint Exposed: No Bone Exposed: No Assessment Notes Calloused periwound Treatment Notes Wound #1 (Toe Great) Wound Laterality: Plantar, Right Cleanser Peri-Wound Care Topical Primary Dressing KerraCel Ag Gelling Fiber Dressing, 2x2 in (silver alginate) Discharge Instruction: Apply silver alginate to wound bed as instructed Secondary Dressing Woven Gauze Sponges 2x2 in Discharge Instruction: Apply over primary dressing as directed. Optifoam Non-Adhesive Dressing, 4x4 in Discharge Instruction: Apply over primary dressing  cut to form donut to help offload Secured With Compression Wrap Compression Stockings Add-Ons Electronic Signature(s) Signed: 09/16/2020 5:08:10 PM By: Lorrin Jackson Signed: 09/21/2020 2:09:33 PM By: Sandre Kitty Entered By: Sandre Kitty on 09/16/2020 16:59:19 -------------------------------------------------------------------------------- Vitals Details Patient Name: Date of Service: [name], [name] 09/16/2020 2:15 PM Medical Record Number: [id number] Patient Account Number: [id number] Date of Birth/Sex: Treating RN: Aug 02, 1950 (70 y.o. Ricky Lloyd Primary Care Kadeshia Kasparian:  [place] Other Clinician: Referring Takyla Kuchera: Treating Deontae Robson/Extender: Ricky Lloyd in Treatment: 5 Vital Signs Time Taken: 14:25 Temperature (F): 98.0 Weight (lbs): 390 Pulse (bpm): 79 Respiratory Rate (breaths/min): 20 Blood Pressure (mmHg): 106/59 Reference Range: 80 - 120 mg / dl Electronic Signature(s) Signed: 09/16/2020 5:08:10 PM By: Lorrin Jackson Entered By: Lorrin Jackson on 09/16/2020 14:26:48

## 2020-09-22 ENCOUNTER — Other Ambulatory Visit: Payer: Self-pay | Admitting: Physician Assistant

## 2020-09-22 ENCOUNTER — Ambulatory Visit (INDEPENDENT_AMBULATORY_CARE_PROVIDER_SITE_OTHER): Payer: Medicare HMO

## 2020-09-22 DIAGNOSIS — L97512 Non-pressure chronic ulcer of other part of right foot with fat layer exposed: Secondary | ICD-10-CM

## 2020-09-22 DIAGNOSIS — E11621 Type 2 diabetes mellitus with foot ulcer: Secondary | ICD-10-CM | POA: Diagnosis not present

## 2020-09-22 DIAGNOSIS — L97509 Non-pressure chronic ulcer of other part of unspecified foot with unspecified severity: Secondary | ICD-10-CM

## 2020-09-29 ENCOUNTER — Other Ambulatory Visit: Payer: Self-pay

## 2020-09-29 ENCOUNTER — Encounter (HOSPITAL_BASED_OUTPATIENT_CLINIC_OR_DEPARTMENT_OTHER): Payer: Medicare HMO | Attending: Physician Assistant | Admitting: Physician Assistant

## 2020-09-29 DIAGNOSIS — E11621 Type 2 diabetes mellitus with foot ulcer: Secondary | ICD-10-CM | POA: Diagnosis not present

## 2020-09-29 DIAGNOSIS — I5042 Chronic combined systolic (congestive) and diastolic (congestive) heart failure: Secondary | ICD-10-CM | POA: Insufficient documentation

## 2020-09-29 DIAGNOSIS — Z951 Presence of aortocoronary bypass graft: Secondary | ICD-10-CM | POA: Diagnosis not present

## 2020-09-29 DIAGNOSIS — E1140 Type 2 diabetes mellitus with diabetic neuropathy, unspecified: Secondary | ICD-10-CM | POA: Diagnosis not present

## 2020-09-29 DIAGNOSIS — I110 Hypertensive heart disease with heart failure: Secondary | ICD-10-CM | POA: Diagnosis not present

## 2020-09-29 DIAGNOSIS — L97519 Non-pressure chronic ulcer of other part of right foot with unspecified severity: Secondary | ICD-10-CM | POA: Diagnosis present

## 2020-09-29 DIAGNOSIS — Z955 Presence of coronary angioplasty implant and graft: Secondary | ICD-10-CM | POA: Insufficient documentation

## 2020-09-29 DIAGNOSIS — L97512 Non-pressure chronic ulcer of other part of right foot with fat layer exposed: Secondary | ICD-10-CM | POA: Insufficient documentation

## 2020-09-29 DIAGNOSIS — I890 Lymphedema, not elsewhere classified: Secondary | ICD-10-CM | POA: Diagnosis not present

## 2020-09-29 DIAGNOSIS — I2510 Atherosclerotic heart disease of native coronary artery without angina pectoris: Secondary | ICD-10-CM | POA: Diagnosis not present

## 2020-09-29 NOTE — Progress Notes (Signed)
KEYVON, HERTER ([id number]) Visit Report for 09/29/2020 Arrival Information Details Patient Name: Date of Service: [place] N[place] [name] 09/29/2020 3:00 PM Medical Record Number: [id number] Patient Account Number: [id number] Date of Birth/Sex: Treating RN: Oct 05, 1950 (70 y.o. [name] Primary Care Ysmael Hires: [place] Other Clinician: Referring Kea Callan: Treating Justun Anaya/Extender: [name] in Treatment: 7 Visit Information History Since Last Visit All ordered tests and consults were Yes Patient Arrived: Ambulatory completed: Arrival Time: 15:40 Added or deleted any medications: No Accompanied By: self Any new allergies or adverse reactions: No Transfer Assistance: None Had a fall or experienced change in No Patient Identification Verified: Yes activities of daily living that may affect Secondary Verification Process Completed: Yes risk of falls: Patient Requires Transmission-Based Precautions: No Signs or symptoms of abuse/neglect since No Patient Has Alerts: No last visito Hospitalized since last visit: No Implantable device outside of the clinic No excluding cellular tissue based products placed in the center since last visit: Has Dressing in Place as Prescribed: Yes Has Footwear/Offloading in Place as Yes Prescribed: Left: Surgical Shoe with Pressure Relief Insole Pain Present Now: Yes Electronic Signature(s) Signed: 09/29/2020 7:02:06 PM By: Baruch Gouty RN, BSN Entered By: Baruch Gouty on 09/29/2020 15:41:01 -------------------------------------------------------------------------------- Lower Extremity Assessment Details Patient Name: Date of Service: [name], [name] 09/29/2020 3:00 PM Medical Record Number: [id number] Patient Account Number: [id number] Date of Birth/Sex: Treating RN: 08/30/1950 (70 y.o. [name] Primary Care Shyniece Scripter: [place] Other Clinician: Referring Ellsworth Waldschmidt: Treating  Takiya Belmares/Extender: [name], [name] in Treatment: 7 Edema Assessment Assessed: [Left: No] [Right: Yes] Edema: [Left: Ye] [Right: s] Calf Left: Right: Point of Measurement: 33 cm From Medial Instep 52.5 cm Ankle Left: Right: Point of Measurement: 16 cm From Medial Instep 32 cm Vascular Assessment Pulses: Dorsalis Pedis Palpable: [Right:Yes] Electronic Signature(s) Signed: 09/29/2020 6:54:10 PM By: Deon Pilling Entered By: Deon Pilling on 09/29/2020 15:48:07 -------------------------------------------------------------------------------- Multi-Disciplinary Care Plan Details Patient Name: Date of Service: [name], [name] 09/29/2020 3:00 PM Medical Record Number: [id number] Patient Account Number: [id number] Date of Birth/Sex: Treating RN: 09-05-1950 (70 y.o. [name] Primary Care Finnegan Gatta: [place] Other Clinician: Referring Jamina Macbeth: Treating Taleen Prosser/Extender: [name] in Treatment: 7 Multidisciplinary Care Plan reviewed with physician Active Inactive Nutrition Nursing Diagnoses: Impaired glucose control: actual or potential Potential for alteratiion in Nutrition/Potential for imbalanced nutrition Goals: Patient/caregiver will maintain therapeutic glucose control Date Initiated: 08/11/2020 Target Resolution Date: 10/06/2020 Goal Status: Active Interventions: Assess HgA1c results as ordered upon admission and as needed Assess patient nutrition upon admission and as needed per policy Provide education on elevated blood sugars and impact on wound healing Treatment Activities: Patient referred to Primary Care Physician for further nutritional evaluation : 08/11/2020 Notes: Wound/Skin Impairment Nursing Diagnoses: Impaired tissue integrity Knowledge deficit related to ulceration/compromised skin integrity Goals: Patient/caregiver will verbalize understanding of skin care regimen Date Initiated:  08/11/2020 Target Resolution Date: 10/06/2020 Goal Status: Active Ulcer/skin breakdown will have a volume reduction of 30% by week 4 Date Initiated: 08/11/2020 Date Inactivated: 09/08/2020 Target Resolution Date: 09/08/2020 Goal Status: Met Ulcer/skin breakdown will have a volume reduction of 50% by week 8 Date Initiated: 09/08/2020 Target Resolution Date: 10/06/2020 Goal Status: Active Interventions: Assess patient/caregiver ability to obtain necessary supplies Assess patient/caregiver ability to perform ulcer/skin care regimen upon admission and as needed Assess ulceration(s) every visit Provide education on ulcer and skin care Treatment Activities:  Skin care regimen initiated : 08/11/2020 Topical wound management initiated : 08/11/2020 Notes: Electronic Signature(s) Signed: 09/29/2020 7:02:06 PM By: Baruch Gouty RN, BSN Entered By: Baruch Gouty on 09/29/2020 16:24:57 -------------------------------------------------------------------------------- Pain Assessment Details Patient Name: Date of Service: [name], [name] 09/29/2020 3:00 PM Medical Record Number: [id number] Patient Account Number: [id number] Date of Birth/Sex: Treating RN: 1951/02/03 (70 y.o. [name] Primary Care Sutton Hirsch: [place] Other Clinician: Referring Brandyn Thien: Treating Venisa Frampton/Extender: [name] in Treatment: 7 Active Problems Location of Pain Severity and Description of Pain Patient Has Paino Yes Site Locations Pain Location: Pain in Ulcers With Dressing Change: No Duration of the Pain. Constant / Intermittento Intermittent Rate the pain. Current Pain Level: 3 Character of Pain Describe the Pain: Sharp, Shooting Pain Management and Medication Current Pain Management: Medication: Yes Cold Application: No Rest: No Massage: No Activity: No T.E.N.S.: No Heat Application: No Leg drop or elevation: No Is the Current Pain Management Adequate:  Adequate How does your wound impact your activities of daily livingo Sleep: No Bathing: No Appetite: No Relationship With Others: No Bladder Continence: No Emotions: No Bowel Continence: No Work: No Toileting: No Drive: No Dressing: No Hobbies: No Electronic Signature(s) Signed: 09/29/2020 7:02:06 PM By: Baruch Gouty RN, BSN Signed: 09/29/2020 7:02:06 PM By: Baruch Gouty RN, BSN Entered By: Baruch Gouty on 09/29/2020 15:42:52 -------------------------------------------------------------------------------- Patient/Caregiver Education Details Patient Name: Date of Service: [name], [name] 5/4/2022andnbsp3:00 PM Medical Record Number: [id number] Patient Account Number: [id number] Date of Birth/Gender: Treating RN: 11-Jul-1950 (70 y.o. [name] Primary Care Physician: [place] Other Clinician: Referring Physician: Treating Physician/Extender: [name] in Treatment: 7 Education Assessment Education Provided To: Patient Education Topics Provided Elevated Blood Sugar/ Impact on Healing: Methods: Explain/Verbal Responses: Reinforcements needed, State content correctly Offloading: Methods: Explain/Verbal Responses: Reinforcements needed, State content correctly Wound/Skin Impairment: Methods: Explain/Verbal Responses: Reinforcements needed, State content correctly Electronic Signature(s) Signed: 09/29/2020 7:02:06 PM By: Baruch Gouty RN, BSN Entered By: Baruch Gouty on 09/29/2020 16:25:28 -------------------------------------------------------------------------------- Wound Assessment Details Patient Name: Date of Service: [name], [name] 09/29/2020 3:00 PM Medical Record Number: [id number] Patient Account Number: [id number] Date of Birth/Sex: Treating RN: Oct 07, 1950 (70 y.o. [name] Primary Care Cordelle Dahmen: [place] Other Clinician: Referring Emilee Market: Treating Emory Leaver/Extender: [name], [name] in Treatment: 7 Wound Status Wound Number: 1 Primary Diabetic Wound/Ulcer of the Lower Extremity Etiology: Wound Location: Right, Plantar T Great oe Wound Open Wounding Event: Trauma Status: Date Acquired: 12/27/2017 Comorbid Lymphedema, Sleep Apnea, Arrhythmia, Congestive Heart Failure, Weeks Of Treatment: 7 History: Coronary Artery Disease, Hypertension, Type II Diabetes, Clustered Wound: No Osteoarthritis, Neuropathy Photos Wound Measurements Length: (cm) 0.3 Width: (cm) 0.3 Depth: (cm) 0.4 Area: (cm) 0.071 Volume: (cm) 0.028 % Reduction in Area: 63.8% % Reduction in Volume: 71.4% Epithelialization: None Tunneling: No Undermining: No Wound Description Classification: Grade 2 Wound Margin: Well defined, not attached Exudate Amount: Medium Exudate Type: Serosanguineous Exudate Color: red, brown Foul Odor After Cleansing: No Slough/Fibrino No Wound Bed Granulation Amount: Large (67-100%) Exposed Structure Granulation Quality: Red, Pink Fascia Exposed: No Necrotic Amount: None Present (0%) Fat Layer (Subcutaneous Tissue) Exposed: Yes Tendon Exposed: No Muscle Exposed: No Joint Exposed: No Bone Exposed: No Assessment Notes callous periwound. Electronic Signature(s) Signed: 09/29/2020 4:55:09 PM By: Sandre Kitty Signed: 09/29/2020 6:54:10 PM By: Deon Pilling Entered By: Sandre Kitty on 09/29/2020 16:34:56 -------------------------------------------------------------------------------- Vitals Details Patient Name:  Date of Service: [name], [name] 09/29/2020 3:00 PM Medical Record Number: [id number] Patient Account Number: [id number] Date of Birth/Sex: Treating RN: January 29, 1951 (70 y.o. [name] Primary Care Raelee Rossmann: [place] Other Clinician: Referring Kasch Borquez: Treating Taquana Bartley/Extender: [name], [name] in Treatment: 7 Vital Signs Time Taken: 15:41 Temperature (F):  98.3 Height (in): 73 Pulse (bpm): 109 Source: Stated Respiratory Rate (breaths/min): 20 Weight (lbs): 374 Blood Pressure (mmHg): 128/85 Source: Stated Capillary Blood Glucose (mg/dl): 148 Body Mass Index (BMI): 49.3 Reference Range: 80 - 120 mg / dl Notes glucose per pt report this am Electronic Signature(s) Signed: 09/29/2020 7:02:06 PM By: Baruch Gouty RN, BSN Entered By: Baruch Gouty on 09/29/2020 15:41:57

## 2020-09-29 NOTE — Progress Notes (Addendum)
CARRON, JAGGI ([id number]) Visit Report for 09/29/2020 Chief Complaint Document Details Patient Name: Date of Service: [name], [name] 09/29/2020 3:00 PM Medical Record Number: [id number] Patient Account Number: [id number] Date of Birth/Sex: Treating RN: 1950-11-02 (70 y.o. [name] Primary Care Provider: Cephus Richer Other Clinician: Referring Provider: Treating Provider/Extender: Nicanor Bake in Treatment: 7 Information Obtained from: Patient Chief Complaint Right 1st T Ulcer oe Electronic Signature(s) Signed: 09/29/2020 3:38:12 PM By: Lenda Kelp PA-C Entered By: Lenda Kelp on 09/29/2020 15:38:12 -------------------------------------------------------------------------------- Debridement Details Patient Name: Date of Service: [name], [name] 09/29/2020 3:00 PM Medical Record Number: [id number] Patient Account Number: [id number] Date of Birth/Sex: Treating RN: May 16, 1951 (70 y.o. [name] Primary Care Provider: Cephus Richer Other Clinician: Referring Provider: Treating Provider/Extender: Nicanor Bake in Treatment: 7 Debridement Performed for Assessment: Wound #1 Right,Plantar T Great oe Performed By: Physician Lenda Kelp, PA Debridement Type: Debridement Severity of Tissue Pre Debridement: Fat layer exposed Level of Consciousness (Pre-procedure): Awake and Alert Pre-procedure Verification/Time Out Yes - 16:20 Taken: Start Time: 16:22 Pain Control: Other : Benzocaine 20% T Area Debrided (L x W): otal 1 (cm) x 1 (cm) = 1 (cm) Tissue and other material debrided: Viable, Non-Viable, Callus, Slough, Subcutaneous, Skin: Epidermis, Slough Level: Skin/Subcutaneous Tissue Debridement Description: Excisional Instrument: Curette Bleeding: Minimum Hemostasis Achieved: Pressure End Time: 16:30 Procedural Pain: 0 Post Procedural Pain: 0 Response to Treatment: Procedure was  tolerated well Level of Consciousness (Post- Awake and Alert procedure): Post Debridement Measurements of Total Wound Length: (cm) 0.4 Width: (cm) 0.4 Depth: (cm) 0.2 Volume: (cm) 0.025 Character of Wound/Ulcer Post Debridement: Improved Severity of Tissue Post Debridement: Fat layer exposed Post Procedure Diagnosis Same as Pre-procedure Electronic Signature(s) Signed: 09/29/2020 6:39:46 PM By: Lenda Kelp PA-C Signed: 09/29/2020 7:02:06 PM By: Zenaida Deed RN, BSN Entered By: Zenaida Deed on 09/29/2020 16:27:48 -------------------------------------------------------------------------------- HPI Details Patient Name: Date of Service: [name], [name] 09/29/2020 3:00 PM Medical Record Number: [id number] Patient Account Number: [id number] Date of Birth/Sex: Treating RN: Sep 25, 1950 (70 y.o. [name] Primary Care Provider: Cephus Richer Other Clinician: Referring Provider: Treating Provider/Extender: Nicanor Bake in Treatment: 7 History of Present Illness HPI Description: 08/11/2020 upon evaluation today patient presents for initial inspection here in our clinic concerning issues he has been having with wounds over the plantar aspect of his great toe on the right which has been present he tells me since 2019. At that time he was cleaning his pool and scraped his toes on the bottom of the pool. 3 of the 4 toes healed without complication this wound however has never closed. He was previously seen at the wound care center in [place]. He tells me that he is used multiple medications included Hydrofera Blue, Aquacel, and unfortunately many other things none of which were ever completely effective. Has not been placed in a total contact cast in fact he did not even know what that was. He does tell me that Unna boots were used at one time as well as other compression wraps to help with leg ulcerations he does have lymphedema but right now his  legs seem to be doing quite well. His ABI is 1.02 today. He does have a history of an A1c 9.06 June 2020. Currently he has been utilizing Aquacel. He tells me he is out of that however. Again he does  have diabetes mellitus type 2, lymphedema, congestive heart failure, and coronary artery disease. 08/18/2020 upon evaluation today patient appears to be doing well with regard to his toe ulceration. Fortunately there is just a little bit of callus buildup here and I think that we can manage that quite nicely with appropriate dressing changes here. Fortunately there is no signs of active infection at this time. No fevers, chills, nausea, vomiting, or diarrhea. Unfortunately after I saw him last he ended up over the next 24 hours developing an issue with his right lower extremity further up around the knee and into the thigh region as well as the calf where he had a significant cellulitis completely unrelated to the toe. Nonetheless he did require antibiotics he was given Bactrim fortunately that seems to be doing significantly better which is great news he did show me the pictures. He also had an x-ray performed at his pain clinic and I did review that personally today it was actually an image that he brought me which was fairly clear I saw no evidence of obvious bony destruction he did have some obvious signs of arthritis but again nothing that I think is definitive for osteomyelitis at this point all this was discussed with the patient today as well. 08/25/2020 upon evaluation today patient's wound actually showed signs of fairly good granulation and epithelization at this point. He does have some callus around the edges of the wound and unfortunately he does have some slough on the surface of the wound as well. This is going require sharp debridement. There is no signs of infection. 09/08/2020 upon evaluation today patient appears to be doing well with regard to her wound. He has been tolerating the  dressing changes without complication. Fortunately there is no signs of active infection at this time. No fevers, chills, nausea, vomiting, or diarrhea. With that being said he still continues developed callus unfortunately which is causing his main issue here. 09/16/2020 patient presents today for 1 week follow-up of his right great toe diabetic foot ulcer. He would like to have a total contact cast placed. He has been using silver alginate to the wound every other day without issues. He denies any fever/chills or purulent drainage. He overall feels well 4/26; patient presents today for close follow-up of his initial cast placement. He states he overall tolerated it well. He has no complaints today. He did mention that this is a wound that has been going on for 3 years and has not improved. 09/29/2020 upon evaluation today patient appears to be doing well with regard to his wounds. He does have some callus buildup around the distal portion of his toe around the wound. I do think the total contact cast will take care of this. Fortunately his x-ray was negative and I think we can go ahead and see about getting the cast back on him at this point. Electronic Signature(s) Signed: 09/29/2020 4:30:04 PM By: Lenda Kelp PA-C Entered By: Lenda Kelp on 09/29/2020 16:30:04 -------------------------------------------------------------------------------- Physical Exam Details Patient Name: Date of Service: [name], [name] 09/29/2020 3:00 PM Medical Record Number: [id number] Patient Account Number: [id number] Date of Birth/Sex: Treating RN: August 30, 1950 (70 y.o. [name] Primary Care Provider: Cephus Richer Other Clinician: Referring Provider: Treating Provider/Extender: Cindy Hazy, [name] in Treatment: 7 Constitutional Well-nourished and well-hydrated in no acute distress. Respiratory normal breathing without difficulty. Psychiatric this patient is able to make  decisions and demonstrates good insight into disease  process. Alert and Oriented x 3. pleasant and cooperative. Notes Patient's wound bed showed signs again of some callus buildup and slough on the surface of the wound I did perform sharp debridement to clear this away today and he tolerated this for the most part across the board without complication. Post debridement wound bed appears to be doing significantly better which is excellent we are going to go ahead and applied a total contact cast today. Electronic Signature(s) Signed: 09/29/2020 4:30:25 PM By: Lenda Kelp PA-C Entered By: Lenda Kelp on 09/29/2020 16:30:24 -------------------------------------------------------------------------------- Physician Orders Details Patient Name: Date of Service: [name], [name] 09/29/2020 3:00 PM Medical Record Number: [id number] Patient Account Number: [id number] Date of Birth/Sex: Treating RN: 1950-11-03 (70 y.o. [name] Primary Care Provider: Cephus Richer Other Clinician: Referring Provider: Treating Provider/Extender: Nicanor Bake in Treatment: 7 Verbal / Phone Orders: No Diagnosis Coding ICD-10 Coding Code Description E11.621 Type 2 diabetes mellitus with foot ulcer L97.512 Non-pressure chronic ulcer of other part of right foot with fat layer exposed E11.40 Type 2 diabetes mellitus with diabetic neuropathy, unspecified I89.0 Lymphedema, not elsewhere classified I50.42 Chronic combined systolic (congestive) and diastolic (congestive) heart failure I25.10 Atherosclerotic heart disease of native coronary artery without angina pectoris Follow-up Appointments ppointment in 1 week. - Wed with [facility] Return A Bathing/ Shower/ Hygiene May shower with protection but do not get wound dressing(s) wet. - may use cast protector or plastic bag to keep cast dry in the shower Edema Control - Lymphedema / SCD / Other Bilateral Lower Extremities Elevate  legs to the level of the heart or above for 30 minutes daily and/or when sitting, a frequency of: - whenever sitting Avoid standing for long periods of time. Patient to wear own compression stockings every day. Exercise regularly Moisturize legs daily. - left leg every night. Off-Loading Total Contact Cast to Right Lower Extremity Wound Treatment Wound #1 - T Great oe Wound Laterality: Plantar, Right Prim Dressing: KerraCel Ag Gelling Fiber Dressing, 2x2 in (silver alginate) (Dispense As Written) Every Other Day/15 Days ary Discharge Instructions: Apply silver alginate to wound bed , tuck into hole Secondary Dressing: Woven Gauze Sponges 2x2 in (Generic) Every Other Day/15 Days Discharge Instructions: Apply over primary dressing as directed. Secondary Dressing: Optifoam Non-Adhesive Dressing, 4x4 in (Generic) Every Other Day/15 Days Discharge Instructions: Apply over primary dressing cut to form donut to help offload Secured With: Conforming Stretch Gauze Bandage, Sterile 2x75 (in/in) Every Other Day/15 Days Discharge Instructions: Secure with stretch gauze as directed. Secured With: Paper Tape, 2x10 (in/yd) Every Other Day/15 Days Discharge Instructions: Secure dressing with tape as directed. Electronic Signature(s) Signed: 09/29/2020 6:39:46 PM By: Lenda Kelp PA-C Signed: 09/29/2020 7:02:06 PM By: Zenaida Deed RN, BSN Entered By: Zenaida Deed on 09/29/2020 16:29:00 -------------------------------------------------------------------------------- Problem List Details Patient Name: Date of Service: [name], [name] 09/29/2020 3:00 PM Medical Record Number: [id number] Patient Account Number: [id number] Date of Birth/Sex: Treating RN: February 03, 1951 (70 y.o. [name] Primary Care Provider: Cephus Richer Other Clinician: Referring Provider: Treating Provider/Extender: Nicanor Bake in Treatment: 7 Active Problems ICD-10 Encounter Code  Description Active Date MDM Diagnosis E11.621 Type 2 diabetes mellitus with foot ulcer 08/11/2020 No Yes L97.512 Non-pressure chronic ulcer of other part of right foot with fat layer exposed 08/11/2020 No Yes E11.40 Type 2 diabetes mellitus with diabetic neuropathy, unspecified 08/11/2020 No Yes I89.0 Lymphedema, not elsewhere classified 08/11/2020  No Yes I50.42 Chronic combined systolic (congestive) and diastolic (congestive) heart failure 08/11/2020 No Yes I25.10 Atherosclerotic heart disease of native coronary artery without angina pectoris 08/11/2020 No Yes Inactive Problems Resolved Problems Electronic Signature(s) Signed: 09/29/2020 3:38:07 PM By: Lenda Kelp PA-C Signed: 09/29/2020 3:38:07 PM By: Lenda Kelp PA-C Entered By: Lenda Kelp on 09/29/2020 15:38:07 -------------------------------------------------------------------------------- Progress Note Details Patient Name: Date of Service: [name], [name] 09/29/2020 3:00 PM Medical Record Number: [id number] Patient Account Number: [id number] Date of Birth/Sex: Treating RN: Apr 12, 1951 (69 y.o. [name] Primary Care Provider: Cephus Richer Other Clinician: Referring Provider: Treating Provider/Extender: Nicanor Bake in Treatment: 7 Subjective Chief Complaint Information obtained from Patient Right 1st T Ulcer oe History of Present Illness (HPI) 08/11/2020 upon evaluation today patient presents for initial inspection here in our clinic concerning issues he has been having with wounds over the plantar aspect of his great toe on the right which has been present he tells me since 2019. At that time he was cleaning his pool and scraped his toes on the bottom of the pool. 3 of the 4 toes healed without complication this wound however has never closed. He was previously seen at the wound care center in [place]. He tells me that he is used multiple medications included Hydrofera Blue,  Aquacel, and unfortunately many other things none of which were ever completely effective. Has not been placed in a total contact cast in fact he did not even know what that was. He does tell me that Unna boots were used at one time as well as other compression wraps to help with leg ulcerations he does have lymphedema but right now his legs seem to be doing quite well. His ABI is 1.02 today. He does have a history of an A1c 9.06 June 2020. Currently he has been utilizing Aquacel. He tells me he is out of that however. Again he does have diabetes mellitus type 2, lymphedema, congestive heart failure, and coronary artery disease. 08/18/2020 upon evaluation today patient appears to be doing well with regard to his toe ulceration. Fortunately there is just a little bit of callus buildup here and I think that we can manage that quite nicely with appropriate dressing changes here. Fortunately there is no signs of active infection at this time. No fevers, chills, nausea, vomiting, or diarrhea. Unfortunately after I saw him last he ended up over the next 24 hours developing an issue with his right lower extremity further up around the knee and into the thigh region as well as the calf where he had a significant cellulitis completely unrelated to the toe. Nonetheless he did require antibiotics he was given Bactrim fortunately that seems to be doing significantly better which is great news he did show me the pictures. He also had an x-ray performed at his pain clinic and I did review that personally today it was actually an image that he brought me which was fairly clear I saw no evidence of obvious bony destruction he did have some obvious signs of arthritis but again nothing that I think is definitive for osteomyelitis at this point all this was discussed with the patient today as well. 08/25/2020 upon evaluation today patient's wound actually showed signs of fairly good granulation and epithelization at this  point. He does have some callus around the edges of the wound and unfortunately he does have some slough on the surface of the wound as well. This  is going require sharp debridement. There is no signs of infection. 09/08/2020 upon evaluation today patient appears to be doing well with regard to her wound. He has been tolerating the dressing changes without complication. Fortunately there is no signs of active infection at this time. No fevers, chills, nausea, vomiting, or diarrhea. With that being said he still continues developed callus unfortunately which is causing his main issue here. 09/16/2020 patient presents today for 1 week follow-up of his right great toe diabetic foot ulcer. He would like to have a total contact cast placed. He has been using silver alginate to the wound every other day without issues. He denies any fever/chills or purulent drainage. He overall feels well 4/26; patient presents today for close follow-up of his initial cast placement. He states he overall tolerated it well. He has no complaints today. He did mention that this is a wound that has been going on for 3 years and has not improved. 09/29/2020 upon evaluation today patient appears to be doing well with regard to his wounds. He does have some callus buildup around the distal portion of his toe around the wound. I do think the total contact cast will take care of this. Fortunately his x-ray was negative and I think we can go ahead and see about getting the cast back on him at this point. Objective Constitutional Well-nourished and well-hydrated in no acute distress. Vitals Time Taken: 3:41 PM, Height: 73 in, Source: Stated, Weight: 374 lbs, Source: Stated, BMI: 49.3, Temperature: 98.3 F, Pulse: 109 bpm, Respiratory Rate: 20 breaths/min, Blood Pressure: 128/85 mmHg, Capillary Blood Glucose: 148 mg/dl. General Notes: glucose per pt report this am Respiratory normal breathing without difficulty. Psychiatric this  patient is able to make decisions and demonstrates good insight into disease process. Alert and Oriented x 3. pleasant and cooperative. General Notes: Patient's wound bed showed signs again of some callus buildup and slough on the surface of the wound I did perform sharp debridement to clear this away today and he tolerated this for the most part across the board without complication. Post debridement wound bed appears to be doing significantly better which is excellent we are going to go ahead and applied a total contact cast today. Integumentary (Hair, Skin) Wound #1 status is Open. Original cause of wound was Trauma. The date acquired was: 12/27/2017. The wound has been in treatment 7 weeks. The wound is located on the [employer],Plantar T Great. The wound measures 0.3cm length x 0.3cm width x 0.4cm depth; 0.071cm^2 area and 0.028cm^3 volume. There is Fat oe Layer (Subcutaneous Tissue) exposed. There is no tunneling or undermining noted. There is a medium amount of serosanguineous drainage noted. The wound margin is well defined and not attached to the wound base. There is large (67-100%) red, pink granulation within the wound bed. There is no necrotic tissue within the wound bed. General Notes: callous periwound. Assessment Active Problems ICD-10 Type 2 diabetes mellitus with foot ulcer Non-pressure chronic ulcer of other part of right foot with fat layer exposed Type 2 diabetes mellitus with diabetic neuropathy, unspecified Lymphedema, not elsewhere classified Chronic combined systolic (congestive) and diastolic (congestive) heart failure Atherosclerotic heart disease of native coronary artery without angina pectoris Procedures Wound #1 Pre-procedure diagnosis of Wound #1 is a Diabetic Wound/Ulcer of the Lower Extremity located on the Right,Plantar T Great .Severity of Tissue Pre oe Debridement is: Fat layer exposed. There was a Excisional Skin/Subcutaneous Tissue Debridement with a total area  of 1 sq cm performed  by Lenda Kelp, PA. With the following instrument(s): Curette to remove Viable and Non-Viable tissue/material. Material removed includes Callus, Subcutaneous Tissue, Slough, and Skin: Epidermis after achieving pain control using Other (Benzocaine 20%). No specimens were taken. A time out was conducted at 16:20, prior to the start of the procedure. A Minimum amount of bleeding was controlled with Pressure. The procedure was tolerated well with a pain level of 0 throughout and a pain level of 0 following the procedure. Post Debridement Measurements: 0.4cm length x 0.4cm width x 0.2cm depth; 0.025cm^3 volume. Character of Wound/Ulcer Post Debridement is improved. Severity of Tissue Post Debridement is: Fat layer exposed. Post procedure Diagnosis Wound #1: Same as Pre-Procedure Pre-procedure diagnosis of Wound #1 is a Diabetic Wound/Ulcer of the Lower Extremity located on the Right,Plantar T Great . There was a T Contact oe otal Cast Procedure by Lenda Kelp, PA. Post procedure Diagnosis Wound #1: Same as Pre-Procedure Plan Follow-up Appointments: Return Appointment in 1 week. - Wed with [employer] Shower/ Hygiene: May shower with protection but do not get wound dressing(s) wet. - may use cast protector or plastic bag to keep cast dry in the shower Edema Control - Lymphedema / SCD / Other: Elevate legs to the level of the heart or above for 30 minutes daily and/or when sitting, a frequency of: - whenever sitting Avoid standing for long periods of time. Patient to wear own compression stockings every day. Exercise regularly Moisturize legs daily. - left leg every night. Off-Loading: T Contact Cast to Right Lower Extremity otal WOUND #1: - T Great Wound Laterality: Plantar, Right oe Prim Dressing: KerraCel Ag Gelling Fiber Dressing, 2x2 in (silver alginate) (Dispense As Written) Every Other Day/15 Days ary Discharge Instructions: Apply silver alginate to wound  bed , tuck into hole Secondary Dressing: Woven Gauze Sponges 2x2 in (Generic) Every Other Day/15 Days Discharge Instructions: Apply over primary dressing as directed. Secondary Dressing: Optifoam Non-Adhesive Dressing, 4x4 in (Generic) Every Other Day/15 Days Discharge Instructions: Apply over primary dressing cut to form donut to help offload Secured With: Conforming Stretch Gauze Bandage, Sterile 2x75 (in/in) Every Other Day/15 Days Discharge Instructions: Secure with stretch gauze as directed. Secured With: Paper Tape, 2x10 (in/yd) Every Other Day/15 Days Discharge Instructions: Secure dressing with tape as directed. 1. I did go ahead and reapply the total contact cast this was put on the final piece by myself today as well and subsequently we will see how this does over the next week. I think that the biggest issue is offloading if we keep the callus from building up I think the wound will heal quite nicely is really not that deep nor that bad. 2. I am also going to recommend that we have the patient going continue with the silver alginate dressing which I think is a good option here for him. We will see patient back for reevaluation in 1 week here in the clinic. If anything worsens or changes patient will contact our office for additional recommendations. Electronic Signature(s) Signed: 09/29/2020 4:31:03 PM By: Lenda Kelp PA-C Entered By: Lenda Kelp on 09/29/2020 16:31:02 -------------------------------------------------------------------------------- Total Contact Cast Details Patient Name: Date of Service: [name], [name] 09/29/2020 3:00 PM Medical Record Number: [id number] Patient Account Number: [id number] Date of Birth/Sex: Treating RN: 1951/05/23 (70 y.o. [name] Primary Care Provider: Cephus Richer Other Clinician: Referring Provider: Treating Provider/Extender: Cindy Hazy, [name] in Treatment: 7 T Contact Cast Applied for Wound  Assessment: otal Wound #1 Right,Plantar T Great oe Performed By: Physician Lenda Kelp, PA Post Procedure Diagnosis Same as Pre-procedure Electronic Signature(s) Signed: 09/29/2020 6:39:46 PM By: Lenda Kelp PA-C Signed: 09/29/2020 7:02:06 PM By: Zenaida Deed RN, BSN Entered By: Zenaida Deed on 09/29/2020 16:25:47 -------------------------------------------------------------------------------- SuperBill Details Patient Name: Date of Service: [name], [name] 09/29/2020 Medical Record Number: [id number] Patient Account Number: [id number] Date of Birth/Sex: Treating RN: 1950-09-18 (70 y.o. [name] Primary Care Provider: Cephus Richer Other Clinician: Referring Provider: Treating Provider/Extender: Nicanor Bake in Treatment: 7 Diagnosis Coding ICD-10 Codes Code Description E11.621 Type 2 diabetes mellitus with foot ulcer L97.512 Non-pressure chronic ulcer of other part of right foot with fat layer exposed E11.40 Type 2 diabetes mellitus with diabetic neuropathy, unspecified I89.0 Lymphedema, not elsewhere classified I50.42 Chronic combined systolic (congestive) and diastolic (congestive) heart failure I25.10 Atherosclerotic heart disease of native coronary artery without angina pectoris Facility Procedures Physician Procedures : CPT4 Code Description Modifier [id number] - WC PHYS SUBQ TISS 20 SQ CM ICD-10 Diagnosis Description L97.512 Non-pressure chronic ulcer of other part of right foot with fat layer exposed Quantity: 1 Electronic Signature(s) Signed: 09/29/2020 4:31:28 PM By: Lenda Kelp PA-C Entered By: Lenda Kelp on 09/29/2020 16:31:27

## 2020-10-06 ENCOUNTER — Encounter (HOSPITAL_BASED_OUTPATIENT_CLINIC_OR_DEPARTMENT_OTHER): Payer: Medicare HMO | Admitting: Physician Assistant

## 2020-10-06 ENCOUNTER — Other Ambulatory Visit: Payer: Self-pay

## 2020-10-06 DIAGNOSIS — E11621 Type 2 diabetes mellitus with foot ulcer: Secondary | ICD-10-CM | POA: Diagnosis not present

## 2020-10-06 NOTE — Progress Notes (Addendum)
ESTEPHAN, GALLARDO ([id number]) Visit Report for 10/06/2020 Chief Complaint Document Details Patient Name: Date of Service: [name], [name] 10/06/2020 2:45 PM Medical Record Number: [id number] Patient Account Number: [id number] Date of Birth/Sex: Treating RN: [birth year] (70 y.o. [name] Primary Care Provider: Cephus Richer Other Clinician: Referring Provider: Treating Provider/Extender: Nicanor Bake in Treatment: 8 Information Obtained from: Patient Chief Complaint Right 1st T Ulcer oe Electronic Signature(s) Signed: 10/06/2020 3:56:35 PM By: Lenda Kelp PA-C Entered By: Lenda Kelp on 10/06/2020 15:56:35 -------------------------------------------------------------------------------- Debridement Details Patient Name: Date of Service: [name], [name] 10/06/2020 2:45 PM Medical Record Number: [id number] Patient Account Number: [id number] Date of Birth/Sex: Treating RN: 06-03-1950 (69 y.o. [name] Primary Care Provider: Cephus Richer Other Clinician: Referring Provider: Treating Provider/Extender: Nicanor Bake in Treatment: 8 Debridement Performed for Assessment: Wound #1 Right,Plantar T Great oe Performed By: Physician Lenda Kelp, PA Debridement Type: Debridement Severity of Tissue Pre Debridement: Fat layer exposed Level of Consciousness (Pre-procedure): Awake and Alert Pre-procedure Verification/Time Out Yes - 15:55 Taken: Start Time: 15:57 T Area Debrided (L x W): otal 0.5 (cm) x 0.5 (cm) = 0.25 (cm) Tissue and other material debrided: Non-Viable, Callus, Skin: Epidermis Level: Skin/Epidermis Debridement Description: Selective/Open Wound Instrument: Curette Bleeding: Minimum Hemostasis Achieved: Pressure End Time: 16:00 Procedural Pain: 0 Post Procedural Pain: 0 Response to Treatment: Procedure was tolerated well Level of Consciousness (Post- Awake and  Alert procedure): Post Debridement Measurements of Total Wound Length: (cm) 0.2 Width: (cm) 0.2 Depth: (cm) 0.1 Volume: (cm) 0.003 Character of Wound/Ulcer Post Debridement: Improved Severity of Tissue Post Debridement: Fat layer exposed Post Procedure Diagnosis Same as Pre-procedure Electronic Signature(s) Signed: 10/06/2020 5:37:47 PM By: Lenda Kelp PA-C Signed: 10/06/2020 6:13:52 PM By: Zenaida Deed RN, BSN Entered By: Zenaida Deed on 10/06/2020 16:00:49 -------------------------------------------------------------------------------- HPI Details Patient Name: Date of Service: [name], [name] 10/06/2020 2:45 PM Medical Record Number: [id number] Patient Account Number: [id number] Date of Birth/Sex: Treating RN: [birth year] (70 y.o. [name] Primary Care Provider: Cephus Richer Other Clinician: Referring Provider: Treating Provider/Extender: Nicanor Bake in Treatment: 8 History of Present Illness HPI Description: 08/11/2020 upon evaluation today patient presents for initial inspection here in our clinic concerning issues he has been having with wounds over the plantar aspect of his great toe on the right which has been present he tells me since 2019. At that time he was cleaning his pool and scraped his toes on the bottom of the pool. 3 of the 4 toes healed without complication this wound however has never closed. He was previously seen at the wound care center in [place]. He tells me that he is used multiple medications included Hydrofera Blue, Aquacel, and unfortunately many other things none of which were ever completely effective. Has not been placed in a total contact cast in fact he did not even know what that was. He does tell me that Unna boots were used at one time as well as other compression wraps to help with leg ulcerations he does have lymphedema but right now his legs seem to be doing quite well. His ABI is 1.02  today. He does have a history of an A1c 9.06 June 2020. Currently he has been utilizing Aquacel. He tells me he is out of that however. Again he does have diabetes mellitus type 2, lymphedema, congestive heart failure, and  coronary artery disease. 08/18/2020 upon evaluation today patient appears to be doing well with regard to his toe ulceration. Fortunately there is just a little bit of callus buildup here and I think that we can manage that quite nicely with appropriate dressing changes here. Fortunately there is no signs of active infection at this time. No fevers, chills, nausea, vomiting, or diarrhea. Unfortunately after I saw him last he ended up over the next 24 hours developing an issue with his right lower extremity further up around the knee and into the thigh region as well as the calf where he had a significant cellulitis completely unrelated to the toe. Nonetheless he did require antibiotics he was given Bactrim fortunately that seems to be doing significantly better which is great news he did show me the pictures. He also had an x-ray performed at his pain clinic and I did review that personally today it was actually an image that he brought me which was fairly clear I saw no evidence of obvious bony destruction he did have some obvious signs of arthritis but again nothing that I think is definitive for osteomyelitis at this point all this was discussed with the patient today as well. 08/25/2020 upon evaluation today patient's wound actually showed signs of fairly good granulation and epithelization at this point. He does have some callus around the edges of the wound and unfortunately he does have some slough on the surface of the wound as well. This is going require sharp debridement. There is no signs of infection. 09/08/2020 upon evaluation today patient appears to be doing well with regard to her wound. He has been tolerating the dressing changes without complication. Fortunately  there is no signs of active infection at this time. No fevers, chills, nausea, vomiting, or diarrhea. With that being said he still continues developed callus unfortunately which is causing his main issue here. 09/16/2020 patient presents today for 1 week follow-up of his right great toe diabetic foot ulcer. He would like to have a total contact cast placed. He has been using silver alginate to the wound every other day without issues. He denies any fever/chills or purulent drainage. He overall feels well 4/26; patient presents today for close follow-up of his initial cast placement. He states he overall tolerated it well. He has no complaints today. He did mention that this is a wound that has been going on for 3 years and has not improved. 09/29/2020 upon evaluation today patient appears to be doing well with regard to his wounds. He does have some callus buildup around the distal portion of his toe around the wound. I do think the total contact cast will take care of this. Fortunately his x-ray was negative and I think we can go ahead and see about getting the cast back on him at this point. 10/06/2020 upon evaluation today patient appears to be doing excellent in regard to his wound. In fact the cast is doing a great job his wound is very close to complete closure and overall I am extremely pleased with where things stand today does have some callus that I am going to clear away. Other than that overall I am very pleased. Electronic Signature(s) Signed: 10/06/2020 5:23:07 PM By: Lenda Kelp PA-C Entered By: Lenda Kelp on 10/06/2020 17:23:07 -------------------------------------------------------------------------------- Physical Exam Details Patient Name: Date of Service: [name], [name] 10/06/2020 2:45 PM Medical Record Number: [id number] Patient Account Number: [id number] Date of Birth/Sex: Treating RN: [birth year] (69  y.o. [name] Primary Care Provider: Cephus Richer  Other Clinician: Referring Provider: Treating Provider/Extender: Cindy Hazy, [name] in Treatment: 8 Constitutional Well-nourished and well-hydrated in no acute distress. Respiratory normal breathing without difficulty. Psychiatric this patient is able to make decisions and demonstrates good insight into disease process. Alert and Oriented x 3. pleasant and cooperative. Notes Patient's wound bed showed signs of good granulation epithelization at this point. There does not appear to be any signs of infection which is great news and overall very pleased with where he stands. I think he is tolerating the cast in excellent fashion I did rub on his opposite side shin when he was in bed 1 night other than that he is having no issues. Electronic Signature(s) Signed: 10/06/2020 5:23:25 PM By: Lenda Kelp PA-C Entered By: Lenda Kelp on 10/06/2020 17:23:25 -------------------------------------------------------------------------------- Physician Orders Details Patient Name: Date of Service: [name], [name] 10/06/2020 2:45 PM Medical Record Number: [id number] Patient Account Number: [id number] Date of Birth/Sex: Treating RN: [birth year] (70 y.o. [name] Primary Care Provider: Cephus Richer Other Clinician: Referring Provider: Treating Provider/Extender: Nicanor Bake in Treatment: 8 Verbal / Phone Orders: No Diagnosis Coding ICD-10 Coding Code Description E11.621 Type 2 diabetes mellitus with foot ulcer L97.512 Non-pressure chronic ulcer of other part of right foot with fat layer exposed E11.40 Type 2 diabetes mellitus with diabetic neuropathy, unspecified I89.0 Lymphedema, not elsewhere classified I50.42 Chronic combined systolic (congestive) and diastolic (congestive) heart failure I25.10 Atherosclerotic heart disease of native coronary artery without angina pectoris Follow-up Appointments ppointment in 1 week. - Wed  with Leonard Schwartz - extra time for cast Return A Bathing/ Shower/ Hygiene May shower with protection but do not get wound dressing(s) wet. - may use cast protector or plastic bag to keep cast dry in the shower Edema Control - Lymphedema / SCD / Other Bilateral Lower Extremities Elevate legs to the level of the heart or above for 30 minutes daily and/or when sitting, a frequency of: - whenever sitting Avoid standing for long periods of time. Patient to wear own compression stockings every day. Exercise regularly Moisturize legs daily. - left leg every night. Off-Loading Total Contact Cast to Right Lower Extremity Wound Treatment Wound #1 - T Great oe Wound Laterality: Plantar, Right Prim Dressing: KerraCel Ag Gelling Fiber Dressing, 2x2 in (silver alginate) 1 x Per Week/15 Days ary Discharge Instructions: Apply silver alginate to wound bed , tuck into hole Secondary Dressing: Woven Gauze Sponges 2x2 in (Generic) 1 x Per Week/15 Days Discharge Instructions: Apply over primary dressing as directed. Secondary Dressing: Optifoam Non-Adhesive Dressing, 4x4 in (Generic) 1 x Per Week/15 Days Discharge Instructions: Apply over primary dressing cut to form donut to help offload Secured With: Conforming Stretch Gauze Bandage, Sterile 2x75 (in/in) 1 x Per Week/15 Days Discharge Instructions: Secure with stretch gauze as directed. Secured With: Paper Tape, 2x10 (in/yd) 1 x Per Week/15 Days Discharge Instructions: Secure dressing with tape as directed. Wound #2 - Lower Leg Wound Laterality: Left, Anterior Prim Dressing: Promogran Prisma Matrix, 4.34 (sq in) (silver collagen) Every Other Day/15 Days ary Discharge Instructions: Moisten collagen with saline or hydrogel Secondary Dressing: Zetuvit Plus Silicone Border Dressing 4x4 (in/in) Every Other Day/15 Days Discharge Instructions: Apply silicone border or large bandaid over primary dressing as directed. Electronic Signature(s) Signed: 10/06/2020 5:37:47  PM By: Lenda Kelp PA-C Signed: 10/06/2020 6:13:52 PM By: Zenaida Deed RN, BSN Entered By: Zenaida Deed  on 10/06/2020 16:03:00 -------------------------------------------------------------------------------- Problem List Details Patient Name: Date of Service: [name], [name] 10/06/2020 2:45 PM Medical Record Number: [id number] Patient Account Number: [id number] Date of Birth/Sex: Treating RN: Apr 14, 1951 (70 y.o. [name] Primary Care Provider: Cephus Richer Other Clinician: Referring Provider: Treating Provider/Extender: Nicanor Bake in Treatment: 8 Active Problems ICD-10 Encounter Code Description Active Date MDM Diagnosis E11.621 Type 2 diabetes mellitus with foot ulcer 08/11/2020 No Yes L97.512 Non-pressure chronic ulcer of other part of right foot with fat layer exposed 08/11/2020 No Yes E11.40 Type 2 diabetes mellitus with diabetic neuropathy, unspecified 08/11/2020 No Yes I89.0 Lymphedema, not elsewhere classified 08/11/2020 No Yes I50.42 Chronic combined systolic (congestive) and diastolic (congestive) heart failure 08/11/2020 No Yes I25.10 Atherosclerotic heart disease of native coronary artery without angina pectoris 08/11/2020 No Yes Inactive Problems Resolved Problems Electronic Signature(s) Signed: 10/06/2020 3:56:21 PM By: Lenda Kelp PA-C Entered By: Lenda Kelp on 10/06/2020 15:56:21 -------------------------------------------------------------------------------- Progress Note Details Patient Name: Date of Service: [name], [name] 10/06/2020 2:45 PM Medical Record Number: [id number] Patient Account Number: [id number] Date of Birth/Sex: Treating RN: [birth year] (70 y.o. [name] Primary Care Provider: Cephus Richer Other Clinician: Referring Provider: Treating Provider/Extender: Nicanor Bake in Treatment: 8 Subjective Chief Complaint Information obtained from  Patient Right 1st T Ulcer oe History of Present Illness (HPI) 08/11/2020 upon evaluation today patient presents for initial inspection here in our clinic concerning issues he has been having with wounds over the plantar aspect of his great toe on the right which has been present he tells me since 2019. At that time he was cleaning his pool and scraped his toes on the bottom of the pool. 3 of the 4 toes healed without complication this wound however has never closed. He was previously seen at the wound care center in [place]. He tells me that he is used multiple medications included Hydrofera Blue, Aquacel, and unfortunately many other things none of which were ever completely effective. Has not been placed in a total contact cast in fact he did not even know what that was. He does tell me that Unna boots were used at one time as well as other compression wraps to help with leg ulcerations he does have lymphedema but right now his legs seem to be doing quite well. His ABI is 1.02 today. He does have a history of an A1c 9.06 June 2020. Currently he has been utilizing Aquacel. He tells me he is out of that however. Again he does have diabetes mellitus type 2, lymphedema, congestive heart failure, and coronary artery disease. 08/18/2020 upon evaluation today patient appears to be doing well with regard to his toe ulceration. Fortunately there is just a little bit of callus buildup here and I think that we can manage that quite nicely with appropriate dressing changes here. Fortunately there is no signs of active infection at this time. No fevers, chills, nausea, vomiting, or diarrhea. Unfortunately after I saw him last he ended up over the next 24 hours developing an issue with his right lower extremity further up around the knee and into the thigh region as well as the calf where he had a significant cellulitis completely unrelated to the toe. Nonetheless he did require antibiotics he was given  Bactrim fortunately that seems to be doing significantly better which is great news he did show me the pictures. He also had an x-ray performed  at his pain clinic and I did review that personally today it was actually an image that he brought me which was fairly clear I saw no evidence of obvious bony destruction he did have some obvious signs of arthritis but again nothing that I think is definitive for osteomyelitis at this point all this was discussed with the patient today as well. 08/25/2020 upon evaluation today patient's wound actually showed signs of fairly good granulation and epithelization at this point. He does have some callus around the edges of the wound and unfortunately he does have some slough on the surface of the wound as well. This is going require sharp debridement. There is no signs of infection. 09/08/2020 upon evaluation today patient appears to be doing well with regard to her wound. He has been tolerating the dressing changes without complication. Fortunately there is no signs of active infection at this time. No fevers, chills, nausea, vomiting, or diarrhea. With that being said he still continues developed callus unfortunately which is causing his main issue here. 09/16/2020 patient presents today for 1 week follow-up of his right great toe diabetic foot ulcer. He would like to have a total contact cast placed. He has been using silver alginate to the wound every other day without issues. He denies any fever/chills or purulent drainage. He overall feels well 4/26; patient presents today for close follow-up of his initial cast placement. He states he overall tolerated it well. He has no complaints today. He did mention that this is a wound that has been going on for 3 years and has not improved. 09/29/2020 upon evaluation today patient appears to be doing well with regard to his wounds. He does have some callus buildup around the distal portion of his toe around the wound. I do  think the total contact cast will take care of this. Fortunately his x-ray was negative and I think we can go ahead and see about getting the cast back on him at this point. 10/06/2020 upon evaluation today patient appears to be doing excellent in regard to his wound. In fact the cast is doing a great job his wound is very close to complete closure and overall I am extremely pleased with where things stand today does have some callus that I am going to clear away. Other than that overall I am very pleased. Objective Constitutional Well-nourished and well-hydrated in no acute distress. Vitals Time Taken: 3:06 PM, Height: 73 in, Weight: 374 lbs, BMI: 49.3, Temperature: 98.3 F, Pulse: 62 bpm, Respiratory Rate: 20 breaths/min, Blood Pressure: 131/74 mmHg, Capillary Blood Glucose: 134 mg/dl. Respiratory normal breathing without difficulty. Psychiatric this patient is able to make decisions and demonstrates good insight into disease process. Alert and Oriented x 3. pleasant and cooperative. General Notes: Patient's wound bed showed signs of good granulation epithelization at this point. There does not appear to be any signs of infection which is great news and overall very pleased with where he stands. I think he is tolerating the cast in excellent fashion I did rub on his opposite side shin when he was in bed 1 night other than that he is having no issues. Integumentary (Hair, Skin) Wound #1 status is Open. Original cause of wound was Trauma. The date acquired was: 12/27/2017. The wound has been in treatment 8 weeks. The wound is located on the [employer]. The wound measures 0.1cm length x 0.1cm width x 0.1cm depth; 0.008cm^2 area and 0.001cm^3 volume. There is Fat oe Layer (Subcutaneous Tissue)  exposed. There is no tunneling or undermining noted. There is a small amount of serosanguineous drainage noted. The wound margin is well defined and not attached to the wound base. There is large  (67-100%) red, pink granulation within the wound bed. There is no necrotic tissue within the wound bed. Wound #2 status is Open. Original cause of wound was Shear/Friction. The date acquired was: 10/06/2020. The wound is located on the Left,Anterior Lower Leg. The wound measures 1.7cm length x 0.7cm width x 0.1cm depth; 0.935cm^2 area and 0.093cm^3 volume. There is Fat Layer (Subcutaneous Tissue) exposed. There is no tunneling or undermining noted. There is a medium amount of serosanguineous drainage noted. The wound margin is distinct with the outline attached to the wound base. There is large (67-100%) red granulation within the wound bed. There is no necrotic tissue within the wound bed. Assessment Active Problems ICD-10 Type 2 diabetes mellitus with foot ulcer Non-pressure chronic ulcer of other part of right foot with fat layer exposed Type 2 diabetes mellitus with diabetic neuropathy, unspecified Lymphedema, not elsewhere classified Chronic combined systolic (congestive) and diastolic (congestive) heart failure Atherosclerotic heart disease of native coronary artery without angina pectoris Procedures Wound #1 Pre-procedure diagnosis of Wound #1 is a Diabetic Wound/Ulcer of the Lower Extremity located on the Right,Plantar T Great .Severity of Tissue Pre oe Debridement is: Fat layer exposed. There was a Selective/Open Wound Skin/Epidermis Debridement with a total area of 0.25 sq cm performed by Lenda Kelp, PA. With the following instrument(s): Curette to remove Non-Viable tissue/material. Material removed includes Callus and Skin: Epidermis and. No specimens were taken. A time out was conducted at 15:55, prior to the start of the procedure. A Minimum amount of bleeding was controlled with Pressure. The procedure was tolerated well with a pain level of 0 throughout and a pain level of 0 following the procedure. Post Debridement Measurements: 0.2cm length x 0.2cm width x 0.1cm depth;  0.003cm^3 volume. Character of Wound/Ulcer Post Debridement is improved. Severity of Tissue Post Debridement is: Fat layer exposed. Post procedure Diagnosis Wound #1: Same as Pre-Procedure Pre-procedure diagnosis of Wound #1 is a Diabetic Wound/Ulcer of the Lower Extremity located on the Right,Plantar T Great . There was a T Contact oe otal Cast Procedure by Lenda Kelp, PA. Post procedure Diagnosis Wound #1: Same as Pre-Procedure Plan Follow-up Appointments: Return Appointment in 1 week. - Wed with Leonard Schwartz - extra time for cast Bathing/ Shower/ Hygiene: May shower with protection but do not get wound dressing(s) wet. - may use cast protector or plastic bag to keep cast dry in the shower Edema Control - Lymphedema / SCD / Other: Elevate legs to the level of the heart or above for 30 minutes daily and/or when sitting, a frequency of: - whenever sitting Avoid standing for long periods of time. Patient to wear own compression stockings every day. Exercise regularly Moisturize legs daily. - left leg every night. Off-Loading: T Contact Cast to Right Lower Extremity otal WOUND #1: - T Great Wound Laterality: Plantar, Right oe Prim Dressing: KerraCel Ag Gelling Fiber Dressing, 2x2 in (silver alginate) 1 x Per Week/15 Days ary Discharge Instructions: Apply silver alginate to wound bed , tuck into hole Secondary Dressing: Woven Gauze Sponges 2x2 in (Generic) 1 x Per Week/15 Days Discharge Instructions: Apply over primary dressing as directed. Secondary Dressing: Optifoam Non-Adhesive Dressing, 4x4 in (Generic) 1 x Per Week/15 Days Discharge Instructions: Apply over primary dressing cut to form donut to help offload Secured With: Conforming  Stretch Gauze Bandage, Sterile 2x75 (in/in) 1 x Per Week/15 Days Discharge Instructions: Secure with stretch gauze as directed. Secured With: Paper Tape, 2x10 (in/yd) 1 x Per Week/15 Days Discharge Instructions: Secure dressing with tape as  directed. WOUND #2: - Lower Leg Wound Laterality: Left, Anterior Prim Dressing: Promogran Prisma Matrix, 4.34 (sq in) (silver collagen) Every Other Day/15 Days ary Discharge Instructions: Moisten collagen with saline or hydrogel Secondary Dressing: Zetuvit Plus Silicone Border Dressing 4x4 (in/in) Every Other Day/15 Days Discharge Instructions: Apply silicone border or large bandaid over primary dressing as directed. 1. Would recommend that we going continue with the wound care measures as before and the patient is in agreement the plan that includes the use of the total contact cast. We will get a continue with the silver alginate as well. 2. I am also can recommend that we continue to monitor for any signs of worsening or infection. If anything changes as compared to previous then the patient should let me know from the standpoint of increased pain or otherwise but outside of that I feel like he will probably be ready for discharge next week. We will see patient back for reevaluation in 1 week here in the clinic. If anything worsens or changes patient will contact our office for additional recommendations. Electronic Signature(s) Signed: 10/06/2020 5:24:20 PM By: Lenda KelpStone III, Dezyre Hoefer PA-C Entered By: Lenda KelpStone III, Greidy Sherard on 10/06/2020 17:24:20 -------------------------------------------------------------------------------- Total Contact Cast Details Patient Name: Date of Service: [name], [name] 10/06/2020 2:45 PM Medical Record Number: [id number] Patient Account Number: [id number] Date of Birth/Sex: Treating RN: [birth year] (70 y.o. [name]) Boehlein, Linda Primary Care Provider: Cephus Richerurner, Miranda Other Clinician: Referring Provider: Treating Provider/Extender: Nicanor BakeStone III, Delayza Lungren Turner, [name] in Treatment: 8 T Contact Cast Applied for Wound Assessment: otal Wound #1 Right,Plantar T Great oe Performed By: Physician Lenda KelpStone III, Viki Carrera, PA Post Procedure Diagnosis Same as  Pre-procedure Electronic Signature(s) Signed: 10/06/2020 5:37:47 PM By: Lenda KelpStone III, Cameren Odwyer PA-C Signed: 10/06/2020 6:13:52 PM By: Zenaida DeedBoehlein, Linda RN, BSN Entered By: Zenaida DeedBoehlein, Linda on 10/06/2020 16:01:03 -------------------------------------------------------------------------------- SuperBill Details Patient Name: Date of Service: [name], [name] 10/06/2020 Medical Record Number: [id number] Patient Account Number: [id number] Date of Birth/Sex: Treating RN: [birth year] (70 y.o. 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 E11.40 Type 2 diabetes mellitus with diabetic neuropathy, unspecified I89.0 Lymphedema, not elsewhere classified I50.42 Chronic combined systolic (congestive) and diastolic (congestive) heart failure I25.10 Atherosclerotic heart disease of native coronary artery without angina pectoris Facility Procedures CPT4 Code: [id number] Description: [phone] - DEBRIDE WOUND 1ST 20 SQ CM OR < ICD-10 Diagnosis Description L97.512 Non-pressure chronic ulcer of other part of right foot with fat layer exposed Modifier: Quantity: 1 Physician Procedures : CPT4 Code Description Modifier [id number] - WC PHYS DEBR WO ANESTH 20 SQ CM ICD-10 Diagnosis Description L97.512 Non-pressure chronic ulcer of other part of right foot with fat layer exposed Quantity: 1 Electronic Signature(s) Signed: 10/06/2020 5:24:31 PM By: Lenda KelpStone III, Alayha Babineaux PA-C Entered By: Lenda KelpStone III, Quinlynn Cuthbert on 10/06/2020 17:24:31

## 2020-10-06 NOTE — Progress Notes (Signed)
Ricky Lloyd, Ricky Lloyd ([id number]) Visit Report for 10/06/2020 Arrival Information Details Patient Name: Date of Service: [place] N[place] [name] 10/06/2020 2:45 PM Medical Record Number: [id number] Patient Account Number: [id number] Date of Birth/Sex: Treating RN: 1950/06/07 (70 y.o. [name] Primary Care [name]: [place] Other Clinician: Referring Laiken Sandy: Treating [name]/Extender: [name] in Treatment: 8 Visit Information History Since Last Visit Added or deleted any medications: No Patient Arrived: Ambulatory Any new allergies or adverse reactions: No Arrival Time: 15:04 Had a fall or experienced change in No Accompanied By: self activities of daily living that may affect Transfer Assistance: None risk of falls: Patient Identification Verified: Yes Signs or symptoms of abuse/neglect since last visito No Secondary Verification Process Completed: Yes Hospitalized since last visit: No Patient Requires Transmission-Based Precautions: No Implantable device outside of the clinic excluding No Patient Has Alerts: No cellular tissue based products placed in the center since last visit: Has Dressing in Place as Prescribed: Yes Pain Present Now: Yes Electronic Signature(s) Signed: 10/06/2020 4:50:41 PM By: Sandre Kitty Entered By: Sandre Kitty on 10/06/2020 15:05:15 -------------------------------------------------------------------------------- Lower Extremity Assessment Details Patient Name: Date of Service: [name], [name] 10/06/2020 2:45 PM Medical Record Number: [id number] Patient Account Number: [id number] Date of Birth/Sex: Treating RN: 03/05/1951 (70 y.o. [name] Primary Care [name]: [place] Other Clinician: Referring Rhetta Cleek: Treating Edan Juday/Extender: [name], [name] in Treatment: 8 Edema Assessment Assessed: [Left: No] [Right: No] Edema: [Left: Ye] [Right:  s] Calf Left: Right: Point of Measurement: 33 cm From Medial Instep 49 cm Ankle Left: Right: Point of Measurement: 16 cm From Medial Instep 29.7 cm Vascular Assessment Pulses: Dorsalis Pedis Palpable: [Right:Yes] Electronic Signature(s) Signed: 10/06/2020 4:50:41 PM By: Sandre Kitty Signed: 10/06/2020 6:13:52 PM By: Baruch Gouty RN, BSN Entered By: Sandre Kitty on 10/06/2020 15:24:26 -------------------------------------------------------------------------------- [place] Details Patient Name: Date of Service: [name], [name] 10/06/2020 2:45 PM Medical Record Number: [id number] Patient Account Number: [id number] Date of Birth/Sex: Treating RN: March 31, 1951 (70 y.o. [name] Primary Care [name]: [place] Other Clinician: Referring [name]: Treating Talena Neira/Extender: [name] in Treatment: 8 Multidisciplinary Care Plan reviewed with physician Active Inactive Nutrition Nursing Diagnoses: Impaired glucose control: actual or potential Potential for alteratiion in Nutrition/Potential for imbalanced nutrition Goals: Patient/caregiver will maintain therapeutic glucose control Date Initiated: 08/11/2020 Target Resolution Date: 11/03/2020 Goal Status: Active Interventions: Assess HgA1c results as ordered upon admission and as needed Assess patient nutrition upon admission and as needed per policy Provide education on elevated blood sugars and impact on wound healing Treatment Activities: Patient referred to Primary Care Physician for further nutritional evaluation : 08/11/2020 Notes: Wound/Skin Impairment Nursing Diagnoses: Impaired tissue integrity Knowledge deficit related to ulceration/compromised skin integrity Goals: Patient/caregiver will verbalize understanding of skin care regimen Date Initiated: 08/11/2020 Target Resolution Date: 11/03/2020 Goal Status: Active Ulcer/skin breakdown will  have a volume reduction of 30% by week 4 Date Initiated: 08/11/2020 Date Inactivated: 09/08/2020 Target Resolution Date: 09/08/2020 Goal Status: Met Ulcer/skin breakdown will have a volume reduction of 50% by week 8 Date Initiated: 09/08/2020 Date Inactivated: 10/06/2020 Target Resolution Date: 10/06/2020 Goal Status: Met Ulcer/skin breakdown will have a volume reduction of 80% by week 12 Date Initiated: 10/06/2020 Target Resolution Date: 11/03/2020 Goal Status: Active Interventions: Assess patient/caregiver ability to obtain necessary supplies Assess patient/caregiver ability to perform ulcer/skin care regimen upon admission and as needed Assess ulceration(s) every  visit Provide education on ulcer and skin care Treatment Activities: Skin care regimen initiated : 08/11/2020 Topical wound management initiated : 08/11/2020 Notes: Electronic Signature(s) Signed: 10/06/2020 6:13:52 PM By: Baruch Gouty RN, BSN Entered By: Baruch Gouty on 10/06/2020 15:58:28 -------------------------------------------------------------------------------- Pain Assessment Details Patient Name: Date of Service: [name], [name] 10/06/2020 2:45 PM Medical Record Number: [id number] Patient Account Number: [id number] Date of Birth/Sex: Treating RN: 1951-04-24 (70 y.o. [name] Primary Care Devron Cohick: [place] Other Clinician: Referring Chade Pitner: Treating Orrin Yurkovich/Extender: [name] in Treatment: 8 Active Problems Location of Pain Severity and Description of Pain Patient Has Paino Yes Site Locations Rate the pain. Current Pain Level: 4 Pain Management and Medication Current Pain Management: Electronic Signature(s) Signed: 10/06/2020 4:50:41 PM By: Sandre Kitty Signed: 10/06/2020 6:13:52 PM By: Baruch Gouty RN, BSN Entered By: Sandre Kitty on 10/06/2020  15:06:54 -------------------------------------------------------------------------------- Patient/Caregiver Education Details Patient Name: Date of Service: [name], [name] 5/11/2022andnbsp2:45 PM Medical Record Number: [id number] Patient Account Number: [id number] Date of Birth/Gender: Treating RN: Oct 17, 1950 (69 y.o. [name] Primary Care Physician: [place] Other Clinician: Referring Physician: Treating Physician/Extender: [name] in Treatment: 8 Education Assessment Education Provided To: Patient Education Topics Provided Elevated Blood Sugar/ Impact on Healing: Methods: Explain/Verbal Responses: Reinforcements needed, State content correctly Offloading: Methods: Explain/Verbal Responses: Reinforcements needed, State content correctly Wound/Skin Impairment: Methods: Explain/Verbal Responses: Reinforcements needed, State content correctly Electronic Signature(s) Signed: 10/06/2020 6:13:52 PM By: Baruch Gouty RN, BSN Entered By: Baruch Gouty on 10/06/2020 15:58:54 -------------------------------------------------------------------------------- Wound Assessment Details Patient Name: Date of Service: [name], [name] 10/06/2020 2:45 PM Medical Record Number: [id number] Patient Account Number: [id number] Date of Birth/Sex: Treating RN: 1950/10/01 (69 y.o. [name] Primary Care Arrie Zuercher: [place] Other Clinician: Referring Annalei Friesz: Treating Teale Goodgame/Extender: [name], [name] in Treatment: 8 Wound Status Wound Number: 1 Primary Diabetic Wound/Ulcer of the Lower Extremity Etiology: Wound Location: Right, Plantar T Great oe Wound Open Wounding Event: Trauma Status: Date Acquired: 12/27/2017 Comorbid Lymphedema, Sleep Apnea, Arrhythmia, Congestive Heart Failure, Weeks Of Treatment: 8 History: Coronary Artery Disease, Hypertension, Type II Diabetes, Clustered Wound:  No Osteoarthritis, Neuropathy Photos Wound Measurements Length: (cm) 0.1 Width: (cm) 0.1 Depth: (cm) 0.1 Area: (cm) 0.008 Volume: (cm) 0.001 % Reduction in Area: 95.9% % Reduction in Volume: 99% Epithelialization: Large (67-100%) Tunneling: No Undermining: No Wound Description Classification: Grade 2 Wound Margin: Well defined, not attached Exudate Amount: Small Exudate Type: Serosanguineous Exudate Color: red, brown Foul Odor After Cleansing: No Slough/Fibrino No Wound Bed Granulation Amount: Large (67-100%) Exposed Structure Granulation Quality: Red, Pink Fascia Exposed: No Necrotic Amount: None Present (0%) Fat Layer (Subcutaneous Tissue) Exposed: Yes Tendon Exposed: No Muscle Exposed: No Joint Exposed: No Bone Exposed: No Electronic Signature(s) Signed: 10/06/2020 4:50:41 PM By: Sandre Kitty Signed: 10/06/2020 6:13:52 PM By: Baruch Gouty RN, BSN Entered By: Sandre Kitty on 10/06/2020 16:24:29 -------------------------------------------------------------------------------- Wound Assessment Details Patient Name: Date of Service: [name], [name] 10/06/2020 2:45 PM Medical Record Number: [id number] Patient Account Number: [id number] Date of Birth/Sex: Treating RN: [birth year] (70 y.o. [name] Primary Care Sharetha Newson: [place] Other Clinician: Referring Chastin Garlitz: Treating Shanyia Stines/Extender: [name], [name] in Treatment: 8 Wound Status Wound Number: 2 Primary Skin T ear Etiology: Wound Location: Left, Anterior Lower Leg Wound Open Wounding Event: Shear/Friction Status: Date Acquired: 10/06/2020 Comorbid Lymphedema, Sleep Apnea, Arrhythmia, Congestive  Heart Failure, Weeks Of Treatment: 0 History: Coronary Artery Disease, Hypertension, Type II Diabetes, Clustered Wound: No Osteoarthritis, Neuropathy Photos Wound Measurements Length: (cm) 1.7 Width: (cm) 0.7 Depth: (cm) 0.1 Area: (cm) 0.935 Volume:  (cm) 0.093 % Reduction in Area: 0% % Reduction in Volume: 0% Epithelialization: Small (1-33%) Tunneling: No Undermining: No Wound Description Classification: Full Thickness Without Exposed Support Structu Wound Margin: Distinct, outline attached Exudate Amount: Medium Exudate Type: Serosanguineous Exudate Color: red, brown Wound Bed Granulation Amount: Large (67-100%) Granulation Quality: Red Necrotic Amount: None Present (0%) res Foul Odor After Cleansing: No Slough/Fibrino No Exposed Structure Fascia Exposed: No Fat Layer (Subcutaneous Tissue) Exposed: Yes Tendon Exposed: No Muscle Exposed: No Joint Exposed: No Bone Exposed: No Electronic Signature(s) Signed: 10/06/2020 4:50:41 PM By: Sandre Kitty Signed: 10/06/2020 6:13:52 PM By: Baruch Gouty RN, BSN Entered By: Sandre Kitty on 10/06/2020 16:23:52 -------------------------------------------------------------------------------- Vitals Details Patient Name: Date of Service: [name], [name] 10/06/2020 2:45 PM Medical Record Number: [id number] Patient Account Number: [id number] Date of Birth/Sex: Treating RN: [birth year] (70 y.o. [name] Primary Care Koichi Platte: [place] Other Clinician: Referring Ryler Laskowski: Treating Seddrick Flax/Extender: [name], [name] in Treatment: 8 Vital Signs Time Taken: 15:06 Temperature (F): 98.3 Height (in): 73 Pulse (bpm): 62 Weight (lbs): 374 Respiratory Rate (breaths/min): 20 Body Mass Index (BMI): 49.3 Blood Pressure (mmHg): 131/74 Capillary Blood Glucose (mg/dl): 134 Reference Range: 80 - 120 mg / dl Electronic Signature(s) Signed: 10/06/2020 4:50:41 PM By: Sandre Kitty Entered By: Sandre Kitty on 10/06/2020 15:06:49

## 2020-10-13 ENCOUNTER — Other Ambulatory Visit: Payer: Self-pay

## 2020-10-13 ENCOUNTER — Encounter (HOSPITAL_BASED_OUTPATIENT_CLINIC_OR_DEPARTMENT_OTHER): Payer: Medicare HMO | Admitting: Physician Assistant

## 2020-10-13 DIAGNOSIS — E11621 Type 2 diabetes mellitus with foot ulcer: Secondary | ICD-10-CM | POA: Diagnosis not present

## 2020-10-13 NOTE — Progress Notes (Addendum)
Ricky, Lloyd ([id number]) Visit Report for 10/13/2020 Chief Complaint Document Details Patient Name: Date of Service: Ricky Lloyd, [name] 10/13/2020 3:45 PM Medical Record Number: [id number] Patient Account Number: [id number] Date of Birth/Sex: Treating RN: 02-09-1951 (70 y.o. Ricky Lloyd Primary Care Provider: Cephus Richer Other Clinician: Referring Provider: Treating Provider/Extender: Nicanor Bake in Treatment: 9 Information Obtained from: Patient Chief Complaint Right 1st T Ulcer oe Electronic Signature(s) Signed: 10/13/2020 4:49:44 PM By: Lenda Kelp PA-C Entered By: Lenda Kelp on 10/13/2020 16:49:43 -------------------------------------------------------------------------------- HPI Details Patient Name: Date of Service: Ricky Lloyd, [name] 10/13/2020 3:45 PM Medical Record Number: [id number] Patient Account Number: [id number] Date of Birth/Sex: Treating RN: Nov 27, 1950 (70 y.o. Ricky Lloyd Primary Care Provider: Cephus Richer Other Clinician: Referring Provider: Treating Provider/Extender: Nicanor Bake in Treatment: 9 History of Present Illness HPI Description: 08/11/2020 upon evaluation today patient presents for initial inspection here in our clinic concerning issues he has been having with wounds over the plantar aspect of his great toe on the right which has been present he tells me since 2019. At that time he was cleaning his pool and scraped his toes on the bottom of the pool. 3 of the 4 toes healed without complication this wound however has never closed. He was previously seen at the wound care center in [place]. He tells me that he is used multiple medications included Hydrofera Blue, Aquacel, and unfortunately many other things none of which were ever completely effective. Has not been placed in a total contact cast in fact he did not even know what that was. He does tell me that  Unna boots were used at one time as well as other compression wraps to help with leg ulcerations he does have lymphedema but right now his legs seem to be doing quite well. His ABI is 1.02 today. He does have a history of an A1c 9.06 June 2020. Currently he has been utilizing Aquacel. He tells me he is out of that however. Again he does have diabetes mellitus type 2, lymphedema, congestive heart failure, and coronary artery disease. 08/18/2020 upon evaluation today patient appears to be doing well with regard to his toe ulceration. Fortunately there is just a little bit of callus buildup here and I think that we can manage that quite nicely with appropriate dressing changes here. Fortunately there is no signs of active infection at this time. No fevers, chills, nausea, vomiting, or diarrhea. Unfortunately after I saw him last he ended up over the next 24 hours developing an issue with his right lower extremity further up around the knee and into the thigh region as well as the calf where he had a significant cellulitis completely unrelated to the toe. Nonetheless he did require antibiotics he was given Bactrim fortunately that seems to be doing significantly better which is great news he did show me the pictures. He also had an x-ray performed at his pain clinic and I did review that personally today it was actually an image that he brought me which was fairly clear I saw no evidence of obvious bony destruction he did have some obvious signs of arthritis but again nothing that I think is definitive for osteomyelitis at this point all this was discussed with the patient today as well. 08/25/2020 upon evaluation today patient's wound actually showed signs of fairly good granulation and epithelization at this point. He does have some callus  around the edges of the wound and unfortunately he does have some slough on the surface of the wound as well. This is going require sharp debridement. There is no  signs of infection. 09/08/2020 upon evaluation today patient appears to be doing well with regard to her wound. He has been tolerating the dressing changes without complication. Fortunately there is no signs of active infection at this time. No fevers, chills, nausea, vomiting, or diarrhea. With that being said he still continues developed callus unfortunately which is causing his main issue here. 09/16/2020 patient presents today for 1 week follow-up of his right great toe diabetic foot ulcer. He would like to have a total contact cast placed. He has been using silver alginate to the wound every other day without issues. He denies any fever/chills or purulent drainage. He overall feels well 4/26; patient presents today for close follow-up of his initial cast placement. He states he overall tolerated it well. He has no complaints today. He did mention that this is a wound that has been going on for 3 years and has not improved. 09/29/2020 upon evaluation today patient appears to be doing well with regard to his wounds. He does have some callus buildup around the distal portion of his toe around the wound. I do think the total contact cast will take care of this. Fortunately his x-ray was negative and I think we can go ahead and see about getting the cast back on him at this point. 10/06/2020 upon evaluation today patient appears to be doing excellent in regard to his wound. In fact the cast is doing a great job his wound is very close to complete closure and overall I am extremely pleased with where things stand today does have some callus that I am going to clear away. Other than that overall I am very pleased. 10/13/2020 upon evaluation today patient appears to be doing well with regard to he is been tolerating the dressing changes without complication. Fortunately there is no signs of active infection overall very pleased with where things stand today. In fact I feel like based on what I am seeing that  his wound is actually probably completely healed. I am going to confirm that. Electronic Signature(s) Signed: 10/13/2020 5:07:14 PM By: Lenda KelpStone III, Naraya Stoneberg PA-C Entered By: Lenda KelpStone III, [place] Perra on 10/13/2020 17:07:14 -------------------------------------------------------------------------------- Physical Exam Details Patient Name: Date of Service: Ricky Lloyd, [name] 10/13/2020 3:45 PM Medical Record Number: [id number] Patient Account Number: [id number] Date of Birth/Sex: Treating RN: 12/23/1950 (70 y.o. Ricky SchoonerM) Boehlein, Linda Primary Care Provider: Cephus Richerurner, Miranda Other Clinician: Referring Provider: Treating Provider/Extender: Cindy HazyStone III, Jameah Rouser Turner, [name] in Treatment: 9 Constitutional Well-nourished and well-hydrated in no acute distress. Respiratory normal breathing without difficulty. Psychiatric this patient is able to make decisions and demonstrates good insight into disease process. Alert and Oriented x 3. pleasant and cooperative. Notes Upon inspection patient's wound bed showed signs of good granulation epithelization at this point. There does not appear to be any evidence of infection which is great news and overall very pleased in that regard. Fortunately I think that the patient is making great progress and I am very pleased with where he stands at this point. Nonetheless I do believe that he is going to require 1 more week of the casting to allow things to toughen up again although I believe it is probably closed there is a chance he may be a small spot that needs to #1 close a little bit  better at least #2 toughen up. Electronic Signature(s) Signed: 10/13/2020 5:08:08 PM By: Lenda Kelp PA-C Entered By: Lenda Kelp on 10/13/2020 17:08:08 -------------------------------------------------------------------------------- Physician Orders Details Patient Name: Date of Service: Ricky Lloyd, [name] 10/13/2020 3:45 PM Medical Record Number: [id number] Patient Account  Number: [id number] Date of Birth/Sex: Treating RN: [birth year] (70 y.o. Ricky Lloyd Primary Care Provider: Cephus Richer Other Clinician: Referring Provider: Treating Provider/Extender: Nicanor Bake in Treatment: 9 Verbal / Phone Orders: No Diagnosis Coding ICD-10 Coding Code Description E11.621 Type 2 diabetes mellitus with foot ulcer L97.512 Non-pressure chronic ulcer of other part of right foot with fat layer exposed E11.40 Type 2 diabetes mellitus with diabetic neuropathy, unspecified I89.0 Lymphedema, not elsewhere classified I50.42 Chronic combined systolic (congestive) and diastolic (congestive) heart failure I25.10 Atherosclerotic heart disease of native coronary artery without angina pectoris Follow-up Appointments ppointment in 1 week. - Wed with Leonard Schwartz - extra time for cast Return A Bathing/ Shower/ Hygiene May shower with protection but do not get wound dressing(s) wet. - may use cast protector or plastic bag to keep cast dry in the shower Edema Control - Lymphedema / SCD / Other Bilateral Lower Extremities Elevate legs to the level of the heart or above for 30 minutes daily and/or when sitting, a frequency of: - whenever sitting Avoid standing for long periods of time. Patient to wear own compression stockings every day. Exercise regularly Moisturize legs daily. - left leg every night. Off-Loading Total Contact Cast to Right Lower Extremity Wound Treatment Wound #1 - T Great oe Wound Laterality: Plantar, Right Prim Dressing: KerraCel Ag Gelling Fiber Dressing, 2x2 in (silver alginate) 1 x Per Week/15 Days ary Discharge Instructions: Apply silver alginate to wound bed , tuck into hole Secondary Dressing: Woven Gauze Sponges 2x2 in (Generic) 1 x Per Week/15 Days Discharge Instructions: Apply over primary dressing as directed. Secondary Dressing: Optifoam Non-Adhesive Dressing, 4x4 in (Generic) 1 x Per Week/15 Days Discharge  Instructions: Apply over primary dressing cut to form donut to help offload Secured With: Conforming Stretch Gauze Bandage, Sterile 2x75 (in/in) 1 x Per Week/15 Days Discharge Instructions: Secure with stretch gauze as directed. Secured With: Paper Tape, 2x10 (in/yd) 1 x Per Week/15 Days Discharge Instructions: Secure dressing with tape as directed. Wound #2 - Lower Leg Wound Laterality: Left, Anterior Prim Dressing: KerraCel Ag Gelling Fiber Dressing, 2x2 in (silver alginate) Every Other Day/15 Days ary Discharge Instructions: Apply silver alginate to wound bed as instructed Secondary Dressing: Woven Gauze Sponge, Non-Sterile 4x4 in Every Other Day/15 Days Discharge Instructions: Apply over primary dressing as directed. Secured With: Insurance underwriter, Sterile 2x75 (in/in) Every Other Day/15 Days Discharge Instructions: Secure with stretch gauze as directed. Secured With: Paper Tape, 2x10 (in/yd) Every Other Day/15 Days Discharge Instructions: Secure dressing with tape as directed. Electronic Signature(s) Signed: 10/13/2020 6:10:17 PM By: Zenaida Deed RN, BSN Signed: 10/14/2020 3:51:27 PM By: Lenda Kelp PA-C Entered By: Zenaida Deed on 10/13/2020 16:57:33 -------------------------------------------------------------------------------- Problem List Details Patient Name: Date of Service: Ricky Lloyd, [name] 10/13/2020 3:45 PM Medical Record Number: [id number] Patient Account Number: [id number] Date of Birth/Sex: Treating RN: June 09, 1950 (70 y.o. Ricky Lloyd Primary Care Provider: Cephus Richer Other Clinician: Referring Provider: Treating Provider/Extender: Nicanor Bake in Treatment: 9 Active Problems ICD-10 Encounter Code Description Active Date MDM Diagnosis E11.621 Type 2 diabetes mellitus with foot ulcer 08/11/2020 No Yes L97.512 Non-pressure chronic ulcer of  other part of right foot with fat layer exposed 08/11/2020  No Yes E11.40 Type 2 diabetes mellitus with diabetic neuropathy, unspecified 08/11/2020 No Yes I89.0 Lymphedema, not elsewhere classified 08/11/2020 No Yes I50.42 Chronic combined systolic (congestive) and diastolic (congestive) heart failure 08/11/2020 No Yes I25.10 Atherosclerotic heart disease of native coronary artery without angina pectoris 08/11/2020 No Yes Inactive Problems Resolved Problems Electronic Signature(s) Signed: 10/13/2020 4:49:38 PM By: Lenda Kelp PA-C Entered By: Lenda Kelp on 10/13/2020 16:49:38 -------------------------------------------------------------------------------- Progress Note Details Patient Name: Date of Service: Ricky Lloyd, [name] 10/13/2020 3:45 PM Medical Record Number: [id number] Patient Account Number: [id number] Date of Birth/Sex: Treating RN: [birth year] (70 y.o. Ricky Lloyd Primary Care Provider: Cephus Richer Other Clinician: Referring Provider: Treating Provider/Extender: Nicanor Bake in Treatment: 9 Subjective Chief Complaint Information obtained from Patient Right 1st T Ulcer oe History of Present Illness (HPI) 08/11/2020 upon evaluation today patient presents for initial inspection here in our clinic concerning issues he has been having with wounds over the plantar aspect of his great toe on the right which has been present he tells me since 2019. At that time he was cleaning his pool and scraped his toes on the bottom of the pool. 3 of the 4 toes healed without complication this wound however has never closed. He was previously seen at the wound care center in [place]. He tells me that he is used multiple medications included Hydrofera Blue, Aquacel, and unfortunately many other things none of which were ever completely effective. Has not been placed in a total contact cast in fact he did not even know what that was. He does tell me that Unna boots were used at one time as well as other  compression wraps to help with leg ulcerations he does have lymphedema but right now his legs seem to be doing quite well. His ABI is 1.02 today. He does have a history of an A1c 9.06 June 2020. Currently he has been utilizing Aquacel. He tells me he is out of that however. Again he does have diabetes mellitus type 2, lymphedema, congestive heart failure, and coronary artery disease. 08/18/2020 upon evaluation today patient appears to be doing well with regard to his toe ulceration. Fortunately there is just a little bit of callus buildup here and I think that we can manage that quite nicely with appropriate dressing changes here. Fortunately there is no signs of active infection at this time. No fevers, chills, nausea, vomiting, or diarrhea. Unfortunately after I saw him last he ended up over the next 24 hours developing an issue with his right lower extremity further up around the knee and into the thigh region as well as the calf where he had a significant cellulitis completely unrelated to the toe. Nonetheless he did require antibiotics he was given Bactrim fortunately that seems to be doing significantly better which is great news he did show me the pictures. He also had an x-ray performed at his pain clinic and I did review that personally today it was actually an image that he brought me which was fairly clear I saw no evidence of obvious bony destruction he did have some obvious signs of arthritis but again nothing that I think is definitive for osteomyelitis at this point all this was discussed with the patient today as well. 08/25/2020 upon evaluation today patient's wound actually showed signs of fairly good granulation and epithelization at this point. He does have some callus around  the edges of the wound and unfortunately he does have some slough on the surface of the wound as well. This is going require sharp debridement. There is no signs of infection. 09/08/2020 upon evaluation today  patient appears to be doing well with regard to her wound. He has been tolerating the dressing changes without complication. Fortunately there is no signs of active infection at this time. No fevers, chills, nausea, vomiting, or diarrhea. With that being said he still continues developed callus unfortunately which is causing his main issue here. 09/16/2020 patient presents today for 1 week follow-up of his right great toe diabetic foot ulcer. He would like to have a total contact cast placed. He has been using silver alginate to the wound every other day without issues. He denies any fever/chills or purulent drainage. He overall feels well 4/26; patient presents today for close follow-up of his initial cast placement. He states he overall tolerated it well. He has no complaints today. He did mention that this is a wound that has been going on for 3 years and has not improved. 09/29/2020 upon evaluation today patient appears to be doing well with regard to his wounds. He does have some callus buildup around the distal portion of his toe around the wound. I do think the total contact cast will take care of this. Fortunately his x-ray was negative and I think we can go ahead and see about getting the cast back on him at this point. 10/06/2020 upon evaluation today patient appears to be doing excellent in regard to his wound. In fact the cast is doing a great job his wound is very close to complete closure and overall I am extremely pleased with where things stand today does have some callus that I am going to clear away. Other than that overall I am very pleased. 10/13/2020 upon evaluation today patient appears to be doing well with regard to he is been tolerating the dressing changes without complication. Fortunately there is no signs of active infection overall very pleased with where things stand today. In fact I feel like based on what I am seeing that his wound is actually probably completely healed. I  am going to confirm that. Objective Constitutional Well-nourished and well-hydrated in no acute distress. Vitals Time Taken: 4:22 PM, Height: 73 in, Weight: 374 lbs, BMI: 49.3, Temperature: 98.3 F, Pulse: 80 bpm, Respiratory Rate: 20 breaths/min, Blood Pressure: 120/67 mmHg, Capillary Blood Glucose: 163 mg/dl. Respiratory normal breathing without difficulty. Psychiatric this patient is able to make decisions and demonstrates good insight into disease process. Alert and Oriented x 3. pleasant and cooperative. General Notes: Upon inspection patient's wound bed showed signs of good granulation epithelization at this point. There does not appear to be any evidence of infection which is great news and overall very pleased in that regard. Fortunately I think that the patient is making great progress and I am very pleased with where he stands at this point. Nonetheless I do believe that he is going to require 1 more week of the casting to allow things to toughen up again although I believe it is probably closed there is a chance he may be a small spot that needs to #1 close a little bit better at least #2 toughen up. Integumentary (Hair, Skin) Wound #1 status is Open. Original cause of wound was Trauma. The date acquired was: 12/27/2017. The wound has been in treatment 9 weeks. The wound is located on the [employer]. The wound  measures 0.1cm length x 0.1cm width x 0.1cm depth; 0.008cm^2 area and 0.001cm^3 volume. There is Fat oe Layer (Subcutaneous Tissue) exposed. There is no tunneling or undermining noted. There is a small amount of serosanguineous drainage noted. The wound margin is well defined and not attached to the wound base. There is large (67-100%) red, pink granulation within the wound bed. There is no necrotic tissue within the wound bed. Wound #2 status is Open. Original cause of wound was Shear/Friction. The date acquired was: 10/06/2020. The wound has been in treatment 1 weeks.  The wound is located on the Left,Anterior Lower Leg. The wound measures 0.5cm length x 1cm width x 0.1cm depth; 0.393cm^2 area and 0.039cm^3 volume. There is Fat Layer (Subcutaneous Tissue) exposed. There is no tunneling or undermining noted. There is a medium amount of serosanguineous drainage noted. The wound margin is distinct with the outline attached to the wound base. There is large (67-100%) red granulation within the wound bed. There is no necrotic tissue within the wound bed. Assessment Active Problems ICD-10 Type 2 diabetes mellitus with foot ulcer Non-pressure chronic ulcer of other part of right foot with fat layer exposed Type 2 diabetes mellitus with diabetic neuropathy, unspecified Lymphedema, not elsewhere classified Chronic combined systolic (congestive) and diastolic (congestive) heart failure Atherosclerotic heart disease of native coronary artery without angina pectoris Procedures Wound #1 Pre-procedure diagnosis of Wound #1 is a Diabetic Wound/Ulcer of the Lower Extremity located on the Right,Plantar T Great . There was a T Contact oe otal Cast Procedure by Lenda Kelp, PA. Post procedure Diagnosis Wound #1: Same as Pre-Procedure Plan Follow-up Appointments: Return Appointment in 1 week. - Wed with Leonard Schwartz - extra time for cast Bathing/ Shower/ Hygiene: May shower with protection but do not get wound dressing(s) wet. - may use cast protector or plastic bag to keep cast dry in the shower Edema Control - Lymphedema / SCD / Other: Elevate legs to the level of the heart or above for 30 minutes daily and/or when sitting, a frequency of: - whenever sitting Avoid standing for long periods of time. Patient to wear own compression stockings every day. Exercise regularly Moisturize legs daily. - left leg every night. Off-Loading: T Contact Cast to Right Lower Extremity otal WOUND #1: - T Great Wound Laterality: Plantar, Right oe Prim Dressing: KerraCel Ag Gelling Fiber  Dressing, 2x2 in (silver alginate) 1 x Per Week/15 Days ary Discharge Instructions: Apply silver alginate to wound bed , tuck into hole Secondary Dressing: Woven Gauze Sponges 2x2 in (Generic) 1 x Per Week/15 Days Discharge Instructions: Apply over primary dressing as directed. Secondary Dressing: Optifoam Non-Adhesive Dressing, 4x4 in (Generic) 1 x Per Week/15 Days Discharge Instructions: Apply over primary dressing cut to form donut to help offload Secured With: Conforming Stretch Gauze Bandage, Sterile 2x75 (in/in) 1 x Per Week/15 Days Discharge Instructions: Secure with stretch gauze as directed. Secured With: Paper Tape, 2x10 (in/yd) 1 x Per Week/15 Days Discharge Instructions: Secure dressing with tape as directed. WOUND #2: - Lower Leg Wound Laterality: Left, Anterior Prim Dressing: KerraCel Ag Gelling Fiber Dressing, 2x2 in (silver alginate) Every Other Day/15 Days ary Discharge Instructions: Apply silver alginate to wound bed as instructed Secondary Dressing: Woven Gauze Sponge, Non-Sterile 4x4 in Every Other Day/15 Days Discharge Instructions: Apply over primary dressing as directed. Secured With: Insurance underwriter, Sterile 2x75 (in/in) Every Other Day/15 Days Discharge Instructions: Secure with stretch gauze as directed. Secured With: Paper Tape, 2x10 (in/yd) Every Other Day/15  Days Discharge Instructions: Secure dressing with tape as directed. 1. I did go ahead and reapply the total contact cast today I do believe this will be the patient's last cast he should be good to go for discharge next week. 2. I am also can recommend that the patient continue to monitor for any signs of worsening or infection. Obviously if he has any issues in that regard he should let me know that this would include increased pain but right now relief like he is doing quite well. We will see patient back for reevaluation in 1 week here in the clinic. If anything worsens or changes patient  will contact our office for additional recommendations. Electronic Signature(s) Signed: 10/13/2020 5:08:34 PM By: Lenda Kelp PA-C Entered By: Lenda Kelp on 10/13/2020 17:08:34 -------------------------------------------------------------------------------- Total Contact Cast Details Patient Name: Date of Service: Ricky Lloyd, [name] 10/13/2020 3:45 PM Medical Record Number: [id number] Patient Account Number: [id number] Date of Birth/Sex: Treating RN: [birth year] (70 y.o. Ricky Lloyd Primary Care Provider: Cephus Richer Other Clinician: Referring Provider: Treating Provider/Extender: Nicanor Bake in Treatment: 9 T Contact Cast Applied for Wound Assessment: otal Wound #1 Right,Plantar T Great oe Performed By: Physician Lenda Kelp, PA Post Procedure Diagnosis Same as Pre-procedure Electronic Signature(s) Signed: 10/13/2020 6:10:17 PM By: Zenaida Deed RN, BSN Signed: 10/14/2020 3:51:27 PM By: Lenda Kelp PA-C Entered By: Zenaida Deed on 10/13/2020 16:58:30 -------------------------------------------------------------------------------- SuperBill Details Patient Name: Date of Service: Ricky Lloyd, [name] 10/13/2020 Medical Record Number: [id number] Patient Account Number: [id number] Date of Birth/Sex: Treating RN: 20-Nov-1950 (70 y.o. 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 E11.40 Type 2 diabetes mellitus with diabetic neuropathy, unspecified I89.0 Lymphedema, not elsewhere classified I50.42 Chronic combined systolic (congestive) and diastolic (congestive) heart failure I25.10 Atherosclerotic heart  disease of native coronary artery without angina pectoris Facility Procedures CPT4 Code: [id number] Description: [phone] - APPLY TOTAL CONTACT LEG CAST ICD-10 Diagnosis Description L97.512 Non-pressure chronic ulcer of other part of right foot with fat layer exposed Modifier: Quantity: 1 Physician Procedures : CPT4 Code Description Modifier [id number] - WC PHYS APPLY TOTAL CONTACT CAST ICD-10 Diagnosis Description L97.512 Non-pressure chronic ulcer of other part of right foot with fat layer exposed Quantity: 1 Electronic Signature(s) Signed: 10/13/2020 5:08:42 PM By: Lenda Kelp PA-C Entered By: Lenda Kelp on 10/13/2020 17:08:42

## 2020-10-14 NOTE — Progress Notes (Addendum)
Ricky Lloyd, Ricky Lloyd ([id number]) Visit Report for 10/13/2020 Arrival Information Details Patient Name: Date of Service: [place] NNA [place] [name] 10/13/2020 3:45 PM Medical Record Number: [id number] Patient Account Number: [id number] Date of Birth/Sex: Treating RN: 10-08-1950 (70 y.o. [name] Primary Care Rakin Lemelle: [place] Hun Other Clinician: Referring Naz Denunzio: Treating Keyonna Comunale/Extender: [name] in Treatment: 9 Visit Information History Since Last Visit Added or deleted any medications: No Patient Arrived: Ambulatory Any new allergies or adverse reactions: No Arrival Time: 16:21 Had a fall or experienced change in No Accompanied By: self activities of daily living that may affect Transfer Assistance: None risk of falls: Patient Identification Verified: Yes Signs or symptoms of abuse/neglect since last visito No Secondary Verification Process Completed: Yes Hospitalized since last visit: No Patient Requires Transmission-Based Precautions: No Implantable device outside of the clinic excluding No Patient Has Alerts: No cellular tissue based products placed in the center since last visit: Has Dressing in Place as Prescribed: Yes Pain Present Now: No Electronic Signature(s) Signed: 10/14/2020 8:07:53 AM By: Sandre Kitty Entered By: Sandre Kitty on 10/13/2020 16:22:13 -------------------------------------------------------------------------------- Encounter Discharge Information Details Patient Name: Date of Service: [name], [name] 10/13/2020 3:45 PM Medical Record Number: [id number] Patient Account Number: [id number] Date of Birth/Sex: Treating RN: 1951-01-03 (69 y.o. [name] Primary Care Ailah Barna: [place] Other Clinician: Referring Claritza July: Treating Natalie Mceuen/Extender: [name] in Treatment: 9 Encounter Discharge Information Items Discharge Condition: Stable Ambulatory  Status: Ambulatory Discharge Destination: Home Transportation: Private Auto Accompanied By: self Schedule Follow-up Appointment: Yes Clinical Summary of Care: Patient Declined Electronic Signature(s) Signed: 10/13/2020 6:10:17 PM By: Baruch Gouty RN, BSN Entered By: Baruch Gouty on 10/13/2020 17:29:20 -------------------------------------------------------------------------------- Lower Extremity Assessment Details Patient Name: Date of Service: [name], [name] 10/13/2020 3:45 PM Medical Record Number: [id number] Patient Account Number: [id number] Date of Birth/Sex: Treating RN: 1950/11/02 (70 y.o. Burnadette Pop, Lauren Primary Care Darnette Lampron: [place] Other Clinician: Referring Navada Osterhout: Treating Rajveer Handler/Extender: [name], [name] in Treatment: 9 Edema Assessment Assessed: Shirlyn Goltz: Yes] Patrice Paradise: Yes] Edema: [Left: Yes] [Right: Yes] Calf Left: Right: Point of Measurement: 33 cm From Medial Instep 54 cm Ankle Left: Right: Point of Measurement: 16 cm From Medial Instep 34 cm Vascular Assessment Pulses: Dorsalis Pedis Palpable: [Right:Yes] Posterior Tibial Palpable: [Right:Yes] Electronic Signature(s) Signed: 10/13/2020 6:38:21 PM By: Rhae Hammock RN Entered By: Rhae Hammock on 10/13/2020 16:45:55 -------------------------------------------------------------------------------- [place] Details Patient Name: Date of Service: [name], [name] 10/13/2020 3:45 PM Medical Record Number: [id number] Patient Account Number: [id number] Date of Birth/Sex: Treating RN: [birth year] (70 y.o. [name] Primary Care Elzabeth Mcquerry: [place] Other Clinician: Referring Chastelyn Athens: Treating Tayvian Holycross/Extender: [name] in Treatment: 9 Multidisciplinary Care Plan reviewed with physician Active Inactive Nutrition Nursing Diagnoses: Impaired glucose control: actual or  potential Potential for alteratiion in Nutrition/Potential for imbalanced nutrition Goals: Patient/caregiver will maintain therapeutic glucose control Date Initiated: 08/11/2020 Target Resolution Date: 11/03/2020 Goal Status: Active Interventions: Assess HgA1c results as ordered upon admission and as needed Assess patient nutrition upon admission and as needed per policy Provide education on elevated blood sugars and impact on wound healing Treatment Activities: Patient referred to Primary Care Physician for further nutritional evaluation : 08/11/2020 Notes: Wound/Skin Impairment Nursing Diagnoses: Impaired tissue integrity Knowledge deficit related to ulceration/compromised skin integrity Goals: Patient/caregiver will verbalize understanding of skin care regimen Date Initiated: 08/11/2020 Target  Resolution Date: 11/03/2020 Goal Status: Active Ulcer/skin breakdown will have a volume reduction of 30% by week 4 Date Initiated: 08/11/2020 Date Inactivated: 09/08/2020 Target Resolution Date: 09/08/2020 Goal Status: Met Ulcer/skin breakdown will have a volume reduction of 50% by week 8 Date Initiated: 09/08/2020 Date Inactivated: 10/06/2020 Target Resolution Date: 10/06/2020 Goal Status: Met Ulcer/skin breakdown will have a volume reduction of 80% by week 12 Date Initiated: 10/06/2020 Target Resolution Date: 11/03/2020 Goal Status: Active Interventions: Assess patient/caregiver ability to obtain necessary supplies Assess patient/caregiver ability to perform ulcer/skin care regimen upon admission and as needed Assess ulceration(s) every visit Provide education on ulcer and skin care Treatment Activities: Skin care regimen initiated : 08/11/2020 Topical wound management initiated : 08/11/2020 Notes: Electronic Signature(s) Signed: 10/13/2020 6:10:17 PM By: Baruch Gouty RN, BSN Entered By: Baruch Gouty on 10/13/2020  16:50:04 -------------------------------------------------------------------------------- Pain Assessment Details Patient Name: Date of Service: [name], [name] 10/13/2020 3:45 PM Medical Record Number: [id number] Patient Account Number: [id number] Date of Birth/Sex: Treating RN: 11-Apr-1951 (70 y.o. [name] Primary Care Hagan Vanauken: [place] Other Clinician: Referring Calla Wedekind: Treating Julee Stoll/Extender: [name] in Treatment: 9 Active Problems Location of Pain Severity and Description of Pain Patient Has Paino No Site Locations Pain Management and Medication Current Pain Management: Electronic Signature(s) Signed: 10/13/2020 6:10:17 PM By: Baruch Gouty RN, BSN Signed: 10/14/2020 8:07:53 AM By: Sandre Kitty Entered By: Sandre Kitty on 10/13/2020 16:23:58 -------------------------------------------------------------------------------- Patient/Caregiver Education Details Patient Name: Date of Service: [name], [name] 5/18/2022andnbsp3:45 PM Medical Record Number: [id number] Patient Account Number: [id number] Date of Birth/Gender: Treating RN: 1951/05/17 (69 y.o. [name] Primary Care Physician: [place] Other Clinician: Referring Physician: Treating Physician/Extender: [name] in Treatment: 9 Education Assessment Education Provided To: Patient Education Topics Provided Elevated Blood Sugar/ Impact on Healing: Methods: Explain/Verbal Responses: Reinforcements needed, State content correctly Offloading: Methods: Explain/Verbal Responses: Reinforcements needed, State content correctly Electronic Signature(s) Signed: 10/13/2020 6:10:17 PM By: Baruch Gouty RN, BSN Entered By: Baruch Gouty on 10/13/2020 16:50:32 -------------------------------------------------------------------------------- Wound Assessment Details Patient Name: Date of Service: CA  NNA DA, [name] 10/13/2020 3:45 PM Medical Record Number: [id number] Patient Account Number: [id number] Date of Birth/Sex: Treating RN: 01/18/1951 (70 y.o. [name] Primary Care Toshika Parrow: [place] Other Clinician: Referring Danielle Mink: Treating Aiyden Lauderback/Extender: [name], [name] in Treatment: 9 Wound Status Wound Number: 1 Primary Diabetic Wound/Ulcer of the Lower Extremity Etiology: Wound Location: Right, Plantar T Great oe Wound Open Wounding Event: Trauma Status: Date Acquired: 12/27/2017 Comorbid Lymphedema, Sleep Apnea, Arrhythmia, Congestive Heart Failure, Weeks Of Treatment: 9 History: Coronary Artery Disease, Hypertension, Type II Diabetes, Clustered Wound: No Osteoarthritis, Neuropathy Photos Wound Measurements Length: (cm) 0.1 Width: (cm) 0.1 Depth: (cm) 0.1 Area: (cm) 0.008 Volume: (cm) 0.001 % Reduction in Area: 95.9% % Reduction in Volume: 99% Epithelialization: Large (67-100%) Tunneling: No Undermining: No Wound Description Classification: Grade 2 Wound Margin: Well defined, not attached Exudate Amount: Small Exudate Type: Serosanguineous Exudate Color: red, brown Foul Odor After Cleansing: No Slough/Fibrino No Wound Bed Granulation Amount: Large (67-100%) Exposed Structure Granulation Quality: Red, Pink Fascia Exposed: No Necrotic Amount: None Present (0%) Fat Layer (Subcutaneous Tissue) Exposed: Yes Tendon Exposed: No Muscle Exposed: No Joint Exposed: No Bone Exposed: No Treatment Notes Wound #1 (Toe Great) Wound Laterality: Plantar, Right Cleanser Peri-Wound Care Topical Primary Dressing KerraCel Ag Gelling Fiber Dressing, 2x2 in (silver alginate) Discharge Instruction: Apply silver  alginate to wound bed , tuck into hole Secondary Dressing Woven Gauze Sponges 2x2 in Discharge Instruction: Apply over primary dressing as directed. Optifoam Non-Adhesive Dressing, 4x4 in Discharge Instruction: Apply  over primary dressing cut to form donut to help offload Secured With Conforming Stretch Gauze Bandage, Sterile 2x75 (in/in) Discharge Instruction: Secure with stretch gauze as directed. Paper Tape, 2x10 (in/yd) Discharge Instruction: Secure dressing with tape as directed. Compression Wrap Compression Stockings Add-Ons Electronic Signature(s) Signed: 10/14/2020 4:57:25 PM By: Sandre Kitty Signed: 10/14/2020 5:19:49 PM By: Baruch Gouty RN, BSN Previous Signature: 10/13/2020 6:10:17 PM Version By: Baruch Gouty RN, BSN Previous Signature: 10/13/2020 6:38:21 PM Version By: Rhae Hammock RN Entered By: Sandre Kitty on 10/14/2020 08:15:23 -------------------------------------------------------------------------------- Wound Assessment Details Patient Name: Date of Service: [name], [name] 10/13/2020 3:45 PM Medical Record Number: [id number] Patient Account Number: [id number] Date of Birth/Sex: Treating RN: 1951-01-16 (70 y.o. [name] Primary Care Junita Kubota: [place] Other Clinician: Referring Tramon Crescenzo: Treating Beaulah Romanek/Extender: [name], [name] in Treatment: 9 Wound Status Wound Number: 2 Primary Skin T ear Etiology: Wound Location: Left, Anterior Lower Leg Wound Open Wounding Event: Shear/Friction Status: Date Acquired: 10/06/2020 Comorbid Lymphedema, Sleep Apnea, Arrhythmia, Congestive Heart Failure, Weeks Of Treatment: 1 History: Coronary Artery Disease, Hypertension, Type II Diabetes, Clustered Wound: No Osteoarthritis, Neuropathy Photos Wound Measurements Length: (cm) 0.5 Width: (cm) 1 Depth: (cm) 0.1 Area: (cm) 0.393 Volume: (cm) 0.039 % Reduction in Area: 58% % Reduction in Volume: 58.1% Epithelialization: Small (1-33%) Tunneling: No Undermining: No Wound Description Classification: Full Thickness Without Exposed Support Structures Wound Margin: Distinct, outline attached Exudate Amount:  Medium Exudate Type: Serosanguineous Exudate Color: red, brown Foul Odor After Cleansing: No Slough/Fibrino No Wound Bed Granulation Amount: Large (67-100%) Exposed Structure Granulation Quality: Red Fascia Exposed: No Necrotic Amount: None Present (0%) Fat Layer (Subcutaneous Tissue) Exposed: Yes Tendon Exposed: No Muscle Exposed: No Joint Exposed: No Bone Exposed: No Treatment Notes Wound #2 (Lower Leg) Wound Laterality: Left, Anterior Cleanser Peri-Wound Care Topical Primary Dressing KerraCel Ag Gelling Fiber Dressing, 2x2 in (silver alginate) Discharge Instruction: Apply silver alginate to wound bed as instructed Secondary Dressing Woven Gauze Sponge, Non-Sterile 4x4 in Discharge Instruction: Apply over primary dressing as directed. Secured With Conforming Stretch Gauze Bandage, Sterile 2x75 (in/in) Discharge Instruction: Secure with stretch gauze as directed. Paper Tape, 2x10 (in/yd) Discharge Instruction: Secure dressing with tape as directed. Compression Wrap Compression Stockings Add-Ons Electronic Signature(s) Signed: 10/14/2020 4:57:25 PM By: Sandre Kitty Signed: 10/14/2020 5:19:49 PM By: Baruch Gouty RN, BSN Previous Signature: 10/13/2020 6:10:17 PM Version By: Baruch Gouty RN, BSN Previous Signature: 10/13/2020 6:38:21 PM Version By: Rhae Hammock RN Entered By: Sandre Kitty on 10/14/2020 08:15:45 -------------------------------------------------------------------------------- Vitals Details Patient Name: Date of Service: [name], [name] 10/13/2020 3:45 PM Medical Record Number: [id number] Patient Account Number: [id number] Date of Birth/Sex: Treating RN: September 16, 1950 (70 y.o. [name] Primary Care Deara Bober: [place] Other Clinician: Referring Christine Morton: Treating Jawaan Adachi/Extender: [name], [name] in Treatment: 9 Vital Signs Time Taken: 16:22 Temperature (F): 98.3 Height (in): 73 Pulse  (bpm): 80 Weight (lbs): 374 Respiratory Rate (breaths/min): 20 Body Mass Index (BMI): 49.3 Blood Pressure (mmHg): 120/67 Capillary Blood Glucose (mg/dl): 163 Reference Range: 80 - 120 mg / dl Electronic Signature(s) Signed: 10/14/2020 8:07:53 AM By: Sandre Kitty Entered By: Sandre Kitty on 10/13/2020 16:22:48

## 2020-10-20 ENCOUNTER — Encounter (HOSPITAL_BASED_OUTPATIENT_CLINIC_OR_DEPARTMENT_OTHER): Payer: Medicare HMO | Admitting: Internal Medicine

## 2020-10-20 ENCOUNTER — Other Ambulatory Visit: Payer: Self-pay

## 2020-10-20 DIAGNOSIS — E1140 Type 2 diabetes mellitus with diabetic neuropathy, unspecified: Secondary | ICD-10-CM | POA: Diagnosis not present

## 2020-10-20 DIAGNOSIS — L97512 Non-pressure chronic ulcer of other part of right foot with fat layer exposed: Secondary | ICD-10-CM | POA: Diagnosis not present

## 2020-10-20 DIAGNOSIS — I890 Lymphedema, not elsewhere classified: Secondary | ICD-10-CM | POA: Diagnosis not present

## 2020-10-20 DIAGNOSIS — E11621 Type 2 diabetes mellitus with foot ulcer: Secondary | ICD-10-CM

## 2020-10-26 NOTE — Progress Notes (Signed)
JUSITN, SALSGIVER ([id number]) Visit Report for 10/20/2020 Arrival Information Details Patient Name: Date of Service: [name], [name] 10/20/2020 3:45 PM Medical Record Number: [id number] Patient Account Number: [id number] Date of Birth/Sex: Treating RN: [birth year] (70 y.o. [name] Primary Care Cuong Moorman: Cephus Richer Other Clinician: Referring Rajohn Henery: Treating Cailan Antonucci/Extender: Marrian Salvage in Treatment: 10 Visit Information History Since Last Visit Added or deleted any medications: No Patient Arrived: Ambulatory Any new allergies or adverse reactions: No Arrival Time: 15:05 Had a fall or experienced change in No Accompanied By: self activities of daily living that may affect Transfer Assistance: None risk of falls: Patient Identification Verified: Yes Signs or symptoms of abuse/neglect since last visito No Secondary Verification Process Completed: Yes Hospitalized since last visit: No Patient Requires Transmission-Based Precautions: No Implantable device outside of the clinic excluding No Patient Has Alerts: No cellular tissue based products placed in the center since last visit: Has Dressing in Place as Prescribed: Yes Has Footwear/Offloading in Place as Prescribed: Yes Right: T Contact Cast otal Pain Present Now: No Electronic Signature(s) Signed: 10/20/2020 4:06:25 PM By: Shawn Stall Entered By: Shawn Stall on 10/20/2020 15:11:24 -------------------------------------------------------------------------------- Clinic Level of Care Assessment Details Patient Name: Date of Service: [name], [name] 10/20/2020 3:45 PM Medical Record Number: [id number] Patient Account Number: [id number] Date of Birth/Sex: Treating RN: 26-Jul-1950 (70 y.o. [name] Primary Care Jacee Enerson: Cephus Richer Other Clinician: Referring Taimi Towe: Treating Lorean Ekstrand/Extender: Marrian Salvage in Treatment:  10 Clinic Level of Care Assessment Items TOOL 4 Quantity Score []  - 0 Use when only an EandM is performed on FOLLOW-UP visit ASSESSMENTS - Nursing Assessment / Reassessment X- 1 10 Reassessment of Co-morbidities (includes updates in patient status) X- 1 5 Reassessment of Adherence to Treatment Plan ASSESSMENTS - Wound and Skin A ssessment / Reassessment X - Simple Wound Assessment / Reassessment - one wound 1 5 X- 1 5 Complex Wound Assessment / Reassessment - multiple wounds X- 1 10 Dermatologic / Skin Assessment (not related to wound area) ASSESSMENTS - Focused Assessment []  - 0 Circumferential Edema Measurements - multi extremities []  - 0 Nutritional Assessment / Counseling / Intervention X- 1 5 Lower Extremity Assessment (monofilament, tuning fork, pulses) []  - 0 Peripheral Arterial Disease Assessment (using hand held doppler) ASSESSMENTS - Ostomy and/or Continence Assessment and Care []  - 0 Incontinence Assessment and Management []  - 0 Ostomy Care Assessment and Management (repouching, etc.) PROCESS - Coordination of Care X - Simple Patient / Family Education for ongoing care 1 15 []  - 0 Complex (extensive) Patient / Family Education for ongoing care X- 1 10 Staff obtains , Records, T Results / Process Orders est []  - 0 Staff telephones HHA, Nursing Homes / Clarify orders / etc []  - 0 Routine Transfer to another Facility (non-emergent condition) []  - 0 Routine Hospital Admission (non-emergent condition) []  - 0 New Admissions / / Ordering NPWT Apligraf, etc. , []  - 0 Emergency Hospital Admission (emergent condition) X- 1 10 Simple Discharge Coordination []  - 0 Complex (extensive) Discharge Coordination PROCESS - Special Needs []  - 0 Pediatric / Minor Patient Management []  - 0 Isolation Patient Management []  - 0 Hearing / Language / Visual special needs []  - 0 Assessment of Community assistance (transportation, D/C  planning, etc.) []  - 0 Additional assistance / Altered mentation []  - 0 Support Surface(s) Assessment (bed, cushion, seat, etc.) INTERVENTIONS - Wound Cleansing / Measurement X - Simple  Wound Cleansing - one wound 1 5 []  - 0 Complex Wound Cleansing - multiple wounds X- 1 5 Wound Imaging (photographs - any number of wounds) []  - 0 Wound Tracing (instead of photographs) []  - 0 Simple Wound Measurement - one wound []  - 0 Complex Wound Measurement - multiple wounds INTERVENTIONS - Wound Dressings X - Small Wound Dressing one or multiple wounds 1 10 []  - 0 Medium Wound Dressing one or multiple wounds []  - 0 Large Wound Dressing one or multiple wounds []  - 0 Application of Medications - topical []  - 0 Application of Medications - injection INTERVENTIONS - Miscellaneous []  - 0 External ear exam []  - 0 Specimen Collection (cultures, biopsies, blood, body fluids, etc.) []  - 0 Specimen(s) / Culture(s) sent or taken to Lab for analysis []  - 0 Patient Transfer (multiple staff / / Similar devices) []  - 0 Simple Staple / Suture removal (25 or less) []  - 0 Complex Staple / Suture removal (26 or more) []  - 0 Hypo / Hyperglycemic Management (close monitor of Blood Glucose) []  - 0 Ankle / Brachial Index (ABI) - do not check if billed separately X- 1 5 Vital Signs Has the patient been seen at the hospital within the last three years: Yes Total Score: 100 Level Of Care: New/Established - Level 3 Electronic Signature(s) Signed: 10/20/2020 4:28:15 PM By: RN, BSN Entered By: on 10/20/2020 15:40:32 -------------------------------------------------------------------------------- Encounter Discharge Information Details Patient Name: Date of Service: [name], [name] 10/20/2020 3:45 PM Medical Record Number: Patient Account Number: Date of Birth/Sex: Treating RN: 09-19-1950 (70 y.o. Primary Care  Antion Andres: Other Clinician: Referring Jeremih Dearmas: Treating Millie Forde/Extender: Nurse, adult in Treatment: 10 Encounter Discharge Information Items Discharge Condition: Stable Ambulatory Status: Ambulatory Discharge Destination: Home Transportation: Private Auto Accompanied By: self Schedule Follow-up Appointment: Yes Clinical Summary of Care: Patient Declined Electronic Signature(s) Signed: 10/20/2020 4:28:15 PM By: RN, BSN Entered By: on 10/20/2020 15:54:27 -------------------------------------------------------------------------------- Lower Extremity Assessment Details Patient Name: Date of Service: [name], [name] 10/20/2020 3:45 PM Medical Record Number: Zenaida Deed Patient Account Number: Zenaida Deed Date of Birth/Sex: Treating RN: Jan 20, 1951 (70 y.o. [id number] Primary Care Alese Furniss: [id number] Other Clinician: Referring Cassadi Purdie: Treating Matheu Ploeger/Extender: 04/19/1951 Weeks in Treatment: 10 Edema Assessment Assessed: 74: Yes] [name]: Yes] Edema: [Left: Yes] [Right: Yes] Calf Left: Right: Point of Measurement: 33 cm From Medial Instep 49 cm 52 cm Ankle Left: Right: Point of Measurement: 16 cm From Medial Instep 30.5 cm 30.5 cm Vascular Assessment Pulses: Dorsalis Pedis Palpable: [Left:Yes] [Right:Yes] Electronic Signature(s) Signed: 10/20/2020 4:06:25 PM By: Marrian Salvage Entered By: 10/22/2020 on 10/20/2020 15:12:30 -------------------------------------------------------------------------------- Multi Wound Chart Details Patient Name: Date of Service: [name], [name] 10/20/2020 3:45 PM Medical Record Number: 10/22/2020 Patient Account Number: 10/22/2020 Date of Birth/Sex: Treating RN: [birth year] (69 y.o. 04/19/1951 Primary Care Zaven Klemens: 73 Other Clinician: Referring Yaret Hush: Treating Ryen Rhames/Extender: [name] in Treatment: 10 Vital Signs Height(in): 73 Capillary Blood Glucose(mg/dl): Cephus Richer Weight(lbs): Osie Bond Pulse(bpm): 67 Body Mass Index(BMI): 49 Blood Pressure(mmHg): 142/81 Temperature(F): 97.8 Respiratory Rate(breaths/min): 20 Photos: [1:No Photos Right, Plantar T Great oe] [2:No Photos Left, Anterior Lower Leg] [N/A:N/A N/A] Wound Location: [1:Trauma] [2:Shear/Friction] [N/A:N/A] Wounding Event: [1:Diabetic Wound/Ulcer of the Lower] [2:Skin Tear] [N/A:N/A] Primary Etiology: [1:Extremity 12/27/2017] [2:10/06/2020] [N/A:N/A] Date Acquired: [1:10] [2:2] [  N/A:N/A] Weeks of Treatment: [1:Healed - Epithelialized] [2:Healed - Epithelialized] [N/A:N/A] Wound Status: [1:0x0x0] [2:0x0x0] [N/A:N/A] Measurements L x W x D (cm) [1:0] [2:0] [N/A:N/A] A (cm) : rea [1:0] [2:0] [N/A:N/A] Volume (cm) : [1:100.00%] [2:100.00%] [N/A:N/A] % Reduction in Area: [1:100.00%] [2:100.00%] [N/A:N/A] % Reduction in Volume: [1:Grade 2] [2:Full Thickness Without Exposed] [N/A:N/A] Classification: [2:Support Structures] Treatment Notes Electronic Signature(s) Signed: 10/20/2020 3:37:03 PM By: Geralyn Corwin DO Signed: 10/20/2020 4:28:15 PM By: Zenaida Deed RN, BSN Entered By: Geralyn Corwin on 10/20/2020 15:32:59 -------------------------------------------------------------------------------- Multi-Disciplinary Care Plan Details Patient Name: Date of Service: [name], [name] 10/20/2020 3:45 PM Medical Record Number: [id number] Patient Account Number: [id number] Date of Birth/Sex: Treating RN: 1950/07/30 (70 y.o. [name] Primary Care Derrich Gaby: Cephus Richer Other Clinician: Referring Tira Lafferty: Treating Mariavictoria Nottingham/Extender: Marrian Salvage in Treatment: 10 Multidisciplinary Care Plan reviewed with physician Active Inactive Electronic Signature(s) Signed: 10/20/2020 4:28:15 PM By: Zenaida Deed RN, BSN Entered By: Zenaida Deed  on 10/20/2020 15:21:51 -------------------------------------------------------------------------------- Pain Assessment Details Patient Name: Date of Service: [name], [name] 10/20/2020 3:45 PM Medical Record Number: [id number] Patient Account Number: [id number] Date of Birth/Sex: Treating RN: 09/17/1950 (70 y.o. [name] Primary Care Mikah Poss: Cephus Richer Other Clinician: Referring Shatina Streets: Treating Shadara Lopez/Extender: Marrian Salvage in Treatment: 10 Active Problems Location of Pain Severity and Description of Pain Patient Has Paino No Site Locations Rate the pain. Current Pain Level: 0 Pain Management and Medication Current Pain Management: Medication: No Cold Application: No Rest: No Massage: No Activity: No T.E.N.S.: No Heat Application: No Leg drop or elevation: No Is the Current Pain Management Adequate: Adequate How does your wound impact your activities of daily livingo Sleep: No Bathing: No Appetite: No Relationship With Others: No Bladder Continence: No Emotions: No Bowel Continence: No Work: No Toileting: No Drive: No Dressing: No Hobbies: No Electronic Signature(s) Signed: 10/20/2020 4:06:25 PM By: Shawn Stall Entered By: Shawn Stall on 10/20/2020 15:12:08 -------------------------------------------------------------------------------- Patient/Caregiver Education Details Patient Name: Date of Service: [name], [name] 5/25/2022andnbsp3:45 PM Medical Record Number: [id number] Patient Account Number: [id number] Date of Birth/Gender: Treating RN: 1950-12-26 (70 y.o. [name] Primary Care Physician: Cephus Richer Other Clinician: Referring Physician: Treating Physician/Extender: Marrian Salvage in Treatment: 10 Education Assessment Education Provided To: Patient Education Topics Provided Elevated Blood Sugar/ Impact on Healing: Methods:  Explain/Verbal Responses: Reinforcements needed, State content correctly Wound/Skin Impairment: Methods: Explain/Verbal Responses: Reinforcements needed, State content correctly Electronic Signature(s) Signed: 10/20/2020 4:28:15 PM By: Zenaida Deed RN, BSN Entered By: Zenaida Deed on 10/20/2020 15:21:34 -------------------------------------------------------------------------------- Wound Assessment Details Patient Name: Date of Service: [name], [name] 10/20/2020 3:45 PM Medical Record Number: [id number] Patient Account Number: [id number] Date of Birth/Sex: Treating RN: 11-24-1950 (70 y.o. [name] Primary Care Yesha Muchow: Cephus Richer Other Clinician: Referring Mariah Gerstenberger: Treating Zyara Riling/Extender: Osie Bond Weeks in Treatment: 10 Wound Status Wound Number: 1 Primary Diabetic Wound/Ulcer of the Lower Extremity Etiology: Wound Location: Right, Plantar T Great oe Wound Healed - Epithelialized Wounding Event: Trauma Status: Date Acquired: 12/27/2017 Comorbid Lymphedema, Sleep Apnea, Arrhythmia, Congestive Heart Failure, Weeks Of Treatment: 10 History: Coronary Artery Disease, Hypertension, Type II Diabetes, Clustered Wound: No Osteoarthritis, Neuropathy Photos Wound Measurements Length: (cm) Width: (cm) Depth: (cm) Area: (cm) Volume: (cm) 0 % Reduction in Area: 100% 0 % Reduction in Volume: 100% 0 Epithelialization: Large (67-100%) 0 0 Wound Description Classification: Grade 2 Wound  Margin: Well defined, not attached Exudate Amount: Small Exudate Type: Serosanguineous Exudate Color: red, brown Foul Odor After Cleansing: No Slough/Fibrino No Wound Bed Granulation Amount: Large (67-100%) Exposed Structure Granulation Quality: Red, Pink Fascia Exposed: No Necrotic Amount: None Present (0%) Fat Layer (Subcutaneous Tissue) Exposed: Yes Tendon Exposed: No Muscle Exposed: No Joint Exposed: No Bone Exposed:  No Electronic Signature(s) Signed: 10/20/2020 4:06:25 PM By: Shawn Stalleaton, Bobbi Signed: 10/26/2020 5:24:49 PM By: Karl Itoawkins, Destiny Entered By: Karl Itoawkins, Destiny on 10/20/2020 15:35:55 -------------------------------------------------------------------------------- Wound Assessment Details Patient Name: Date of Service: [name], [name] 10/20/2020 3:45 PM Medical Record Number: [id number] Patient Account Number: [id number] Date of Birth/Sex: Treating RN: 1950/07/19 (70 y.o. Tammy SoursM) Deaton, Bobbi Primary Care Senica Crall: Cephus Richerurner, Miranda Other Clinician: Referring Leighanna Kirn: Treating Yerlin Gasparyan/Extender: Osie BondHoffman, Jessica Turner, [name] in Treatment: 10 Wound Status Wound Number: 2 Primary Skin T ear Etiology: Wound Location: Left, Anterior Lower Leg Wound Healed - Epithelialized Wounding Event: Shear/Friction Status: Date Acquired: 10/06/2020 Comorbid Lymphedema, Sleep Apnea, Arrhythmia, Congestive Heart Failure, Weeks Of Treatment: 2 History: Coronary Artery Disease, Hypertension, Type II Diabetes, Clustered Wound: No Osteoarthritis, Neuropathy Photos Wound Measurements Length: (cm) Width: (cm) Depth: (cm) Area: (cm) Volume: (cm) 0 % Reduction in Area: 100% 0 % Reduction in Volume: 100% 0 Epithelialization: Small (1-33%) 0 0 Wound Description Classification: Full Thickness Without Exposed Support Structures Wound Margin: Distinct, outline attached Exudate Amount: Medium Exudate Type: Serosanguineous Exudate Color: red, brown Foul Odor After Cleansing: No Slough/Fibrino No Wound Bed Granulation Amount: Large (67-100%) Exposed Structure Granulation Quality: Red Fascia Exposed: No Necrotic Amount: None Present (0%) Fat Layer (Subcutaneous Tissue) Exposed: Yes Tendon Exposed: No Muscle Exposed: No Joint Exposed: No Bone Exposed: No Electronic Signature(s) Signed: 10/20/2020 4:06:25 PM By: Shawn Stalleaton, Bobbi Signed: 10/26/2020 5:24:49 PM By: Karl Itoawkins, Destiny Entered By:  Karl Itoawkins, Destiny on 10/20/2020 15:36:54 -------------------------------------------------------------------------------- Vitals Details Patient Name: Date of Service: [name], [name] 10/20/2020 3:45 PM Medical Record Number: [id number] Patient Account Number: [id number] Date of Birth/Sex: Treating RN: 1950/07/19 (70 y.o. Tammy SoursM) Deaton, Bobbi Primary Care Vandana Haman: Cephus Richerurner, Miranda Other Clinician: Referring Klinton Candelas: Treating Faustina Gebert/Extender: Marrian SalvageHoffman, Jessica Turner, [name] in Treatment: 10 Vital Signs Time Taken: 15:06 Temperature (F): 97.8 Height (in): 73 Pulse (bpm): 67 Weight (lbs): 374 Respiratory Rate (breaths/min): 20 Body Mass Index (BMI): 49.3 Blood Pressure (mmHg): 142/81 Capillary Blood Glucose (mg/dl): 782116 Reference Range: 80 - 120 mg / dl Electronic Signature(s) Signed: 10/20/2020 4:06:25 PM By: Shawn Stalleaton, Bobbi Entered By: Shawn Stalleaton, Bobbi on 10/20/2020 15:11:57

## 2020-10-27 NOTE — Progress Notes (Signed)
Ricky, Lloyd ([id number]) Visit Report for 10/20/2020 Chief Complaint Document Details Patient Name: Date of Service: Ricky Lloyd, [name] 10/20/2020 3:45 PM Medical Record Number: [id number] Patient Account Number: [id number] Date of Birth/Sex: Treating RN: [birth year] (70 y.o. Ricky Lloyd Primary Care Provider: Cephus Richer Other Clinician: Referring Provider: Treating Provider/Extender: Marrian Salvage in Treatment: 10 Information Obtained from: Patient Chief Complaint Right 1st T Ulcer oe Electronic Signature(s) Signed: 10/20/2020 3:37:03 PM By: Geralyn Corwin DO Entered By: Geralyn Corwin on 10/20/2020 15:33:05 -------------------------------------------------------------------------------- HPI Details Patient Name: Date of Service: Ricky Lloyd, [name] 10/20/2020 3:45 PM Medical Record Number: [id number] Patient Account Number: [id number] Date of Birth/Sex: Treating RN: 06/08/1950 (69 y.o. Ricky Lloyd Primary Care Provider: Cephus Richer Other Clinician: Referring Provider: Treating Provider/Extender: Marrian Salvage in Treatment: 10 History of Present Illness HPI Description: 08/11/2020 upon evaluation today patient presents for initial inspection here in our clinic concerning issues he has been having with wounds over the plantar aspect of his great toe on the right which has been present he tells me since 2019. At that time he was cleaning his pool and scraped his toes on the bottom of the pool. 3 of the 4 toes healed without complication this wound however has never closed. He was previously seen at the wound care center in [place]. He tells me that he is used multiple medications included Hydrofera Blue, Aquacel, and unfortunately many other things none of which were ever completely effective. Has not been placed in a total contact cast in fact he did not even know what that was. He does tell me  that Unna boots were used at one time as well as other compression wraps to help with leg ulcerations he does have lymphedema but right now his legs seem to be doing quite well. His ABI is 1.02 today. He does have a history of an A1c 9.06 June 2020. Currently he has been utilizing Aquacel. He tells me he is out of that however. Again he does have diabetes mellitus type 2, lymphedema, congestive heart failure, and coronary artery disease. 08/18/2020 upon evaluation today patient appears to be doing well with regard to his toe ulceration. Fortunately there is just a little bit of callus buildup here and I think that we can manage that quite nicely with appropriate dressing changes here. Fortunately there is no signs of active infection at this time. No fevers, chills, nausea, vomiting, or diarrhea. Unfortunately after I saw him last he ended up over the next 24 hours developing an issue with his right lower extremity further up around the knee and into the thigh region as well as the calf where he had a significant cellulitis completely unrelated to the toe. Nonetheless he did require antibiotics he was given Bactrim fortunately that seems to be doing significantly better which is great news he did show me the pictures. He also had an x-ray performed at his pain clinic and I did review that personally today it was actually an image that he brought me which was fairly clear I saw no evidence of obvious bony destruction he did have some obvious signs of arthritis but again nothing that I think is definitive for osteomyelitis at this point all this was discussed with the patient today as well. 08/25/2020 upon evaluation today patient's wound actually showed signs of fairly good granulation and epithelization at this point. He does have some callus around the edges of  the wound and unfortunately he does have some slough on the surface of the wound as well. This is going require sharp debridement. There is no  signs of infection. 09/08/2020 upon evaluation today patient appears to be doing well with regard to her wound. He has been tolerating the dressing changes without complication. Fortunately there is no signs of active infection at this time. No fevers, chills, nausea, vomiting, or diarrhea. With that being said he still continues developed callus unfortunately which is causing his main issue here. 09/16/2020 patient presents today for 1 week follow-up of his right great toe diabetic foot ulcer. He would like to have a total contact cast placed. He has been using silver alginate to the wound every other day without issues. He denies any fever/chills or purulent drainage. He overall feels well 4/26; patient presents today for close follow-up of his initial cast placement. He states he overall tolerated it well. He has no complaints today. He did mention that this is a wound that has been going on for 3 years and has not improved. 09/29/2020 upon evaluation today patient appears to be doing well with regard to his wounds. He does have some callus buildup around the distal portion of his toe around the wound. I do think the total contact cast will take care of this. Fortunately his x-ray was negative and I think we can go ahead and see about getting the cast back on him at this point. 10/06/2020 upon evaluation today patient appears to be doing excellent in regard to his wound. In fact the cast is doing a great job his wound is very close to complete closure and overall I am extremely pleased with where things stand today does have some callus that I am going to clear away. Other than that overall I am very pleased. 10/13/2020 upon evaluation today patient appears to be doing well with regard to he is been tolerating the dressing changes without complication. Fortunately there is no signs of active infection overall very pleased with where things stand today. In fact I feel like based on what I am seeing that  his wound is actually probably completely healed. I am going to confirm that. 5/25; patient presents for 1 week follow-up. He has been using the total contact cast and tolerating this well. He has no issues today or complaints. He denies signs of infection. Electronic Signature(s) Signed: 10/20/2020 3:37:03 PM By: Geralyn Corwin DO Entered By: Geralyn Corwin on 10/20/2020 15:33:30 -------------------------------------------------------------------------------- Physical Exam Details Patient Name: Date of Service: Ricky Lloyd, [name] 10/20/2020 3:45 PM Medical Record Number: [id number] Patient Account Number: [id number] Date of Birth/Sex: Treating RN: 09-09-1950 (70 y.o. Ricky Lloyd Primary Care Provider: Cephus Richer Other Clinician: Referring Provider: Treating Provider/Extender: Marrian Salvage in Treatment: 10 Constitutional respirations regular, non-labored and within target range for patient.Marland Kitchen Psychiatric pleasant and cooperative. Notes There is epithelialization to the previous wound site to the plantar aspect of right great toe Electronic Signature(s) Signed: 10/20/2020 3:37:03 PM By: Geralyn Corwin DO Entered By: Geralyn Corwin on 10/20/2020 15:34:33 -------------------------------------------------------------------------------- Physician Orders Details Patient Name: Date of Service: Ricky Lloyd, [name] 10/20/2020 3:45 PM Medical Record Number: [id number] Patient Account Number: [id number] Date of Birth/Sex: Treating RN: 1951-04-16 (70 y.o. 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 Patient to wear own compression stockings every day. Exercise regularly Moisturize legs daily. - both legs every night. Non Wound Condition Protect area with: - callous cushion or padding to right great toe for next 2-3 weeks to protect Electronic Signature(s) Signed: 10/20/2020 3:37:03 PM By: Geralyn Corwin DO Entered By: Geralyn Corwin on 10/20/2020 15:34:43 -------------------------------------------------------------------------------- Problem List Details Patient Name: Date of Service: Ricky Lloyd, [name] 10/20/2020 3:45 PM Medical Record Number: [id number] Patient Account Number: [id number] Date of Birth/Sex: Treating RN: 05-Dec-1950 (70 y.o. Ricky Lloyd Primary Care Provider: Cephus Richer Other Clinician: Referring Provider: Treating Provider/Extender: Marrian Salvage in Treatment: 10 Active Problems ICD-10 Encounter Code Description Active Date MDM Diagnosis E11.621 Type 2 diabetes mellitus with foot ulcer 08/11/2020 No Yes L97.512 Non-pressure chronic ulcer of other part of right foot with fat layer exposed 08/11/2020 No Yes E11.40 Type 2 diabetes mellitus with diabetic neuropathy, unspecified 08/11/2020 No Yes I89.0 Lymphedema, not elsewhere classified 08/11/2020 No Yes I50.42 Chronic combined systolic (congestive) and diastolic (congestive) heart  failure 08/11/2020 No Yes I25.10 Atherosclerotic heart disease of native coronary artery without angina pectoris 08/11/2020 No Yes Inactive Problems Resolved Problems Electronic Signature(s) Signed: 10/20/2020 3:37:03 PM By: Geralyn Corwin DO Entered By: Geralyn Corwin on 10/20/2020 15:32:53 -------------------------------------------------------------------------------- Progress Note Details Patient Name: Date of Service: Ricky Lloyd, [name] 10/20/2020 3:45 PM Medical Record Number: [id number] Patient Account Number: [id number] Date of Birth/Sex: Treating RN: 08/04/1950 (70 y.o. Ricky Lloyd Primary Care Provider: Cephus Richer Other Clinician: Referring Provider: Treating Provider/Extender: Marrian Salvage in Treatment: 10 Subjective Chief Complaint Information obtained from Patient Right 1st T Ulcer oe History of Present Illness (HPI) 08/11/2020 upon evaluation today patient presents for initial inspection here in our clinic concerning issues he has been having with wounds over the plantar aspect of his great toe on the right which has been present he tells me since 2019. At that time he was cleaning his pool and scraped his toes on the bottom of the pool. 3 of the 4 toes healed without complication this wound however has never closed. He was previously seen at the wound care center in [place]. He tells me that he is used multiple medications included Hydrofera Blue, Aquacel, and unfortunately many other things none of which were ever completely effective. Has not been placed in a total contact cast in fact he did not even know what that was. He does tell me that Unna boots were used at one time as well as other compression wraps to help with leg ulcerations he does have lymphedema but right now his legs seem to be doing quite well. His ABI is 1.02 today. He does have a history of an A1c 9.06 June 2020. Currently he has been utilizing Aquacel. He  tells me he is out of that however. Again he  does have diabetes mellitus type 2, lymphedema, congestive heart failure, and coronary artery disease. 08/18/2020 upon evaluation today patient appears to be doing well with regard to his toe ulceration. Fortunately there is just a little bit of callus buildup here and I think that we can manage that quite nicely with appropriate dressing changes here. Fortunately there is no signs of active infection at this time. No fevers, chills, nausea, vomiting, or diarrhea. Unfortunately after I saw him last he ended up over the next 24 hours developing an issue with his right lower extremity further up around the knee and into the thigh region as well as the calf where he had a significant cellulitis completely unrelated to the toe. Nonetheless he did require antibiotics he was given Bactrim fortunately that seems to be doing significantly better which is great news he did show me the pictures. He also had an x-ray performed at his pain clinic and I did review that personally today it was actually an image that he brought me which was fairly clear I saw no evidence of obvious bony destruction he did have some obvious signs of arthritis but again nothing that I think is definitive for osteomyelitis at this point all this was discussed with the patient today as well. 08/25/2020 upon evaluation today patient's wound actually showed signs of fairly good granulation and epithelization at this point. He does have some callus around the edges of the wound and unfortunately he does have some slough on the surface of the wound as well. This is going require sharp debridement. There is no signs of infection. 09/08/2020 upon evaluation today patient appears to be doing well with regard to her wound. He has been tolerating the dressing changes without complication. Fortunately there is no signs of active infection at this time. No fevers, chills, nausea, vomiting, or diarrhea. With  that being said he still continues developed callus unfortunately which is causing his main issue here. 09/16/2020 patient presents today for 1 week follow-up of his right great toe diabetic foot ulcer. He would like to have a total contact cast placed. He has been using silver alginate to the wound every other day without issues. He denies any fever/chills or purulent drainage. He overall feels well 4/26; patient presents today for close follow-up of his initial cast placement. He states he overall tolerated it well. He has no complaints today. He did mention that this is a wound that has been going on for 3 years and has not improved. 09/29/2020 upon evaluation today patient appears to be doing well with regard to his wounds. He does have some callus buildup around the distal portion of his toe around the wound. I do think the total contact cast will take care of this. Fortunately his x-ray was negative and I think we can go ahead and see about getting the cast back on him at this point. 10/06/2020 upon evaluation today patient appears to be doing excellent in regard to his wound. In fact the cast is doing a great job his wound is very close to complete closure and overall I am extremely pleased with where things stand today does have some callus that I am going to clear away. Other than that overall I am very pleased. 10/13/2020 upon evaluation today patient appears to be doing well with regard to he is been tolerating the dressing changes without complication. Fortunately there is no signs of active infection overall very pleased with where things stand today. In fact I feel  like based on what I am seeing that his wound is actually probably completely healed. I am going to confirm that. 5/25; patient presents for 1 week follow-up. He has been using the total contact cast and tolerating this well. He has no issues today or complaints. He denies signs of infection. Patient History Information obtained  from Patient. Family History Unknown History. Social History Never smoker, Marital Status - Married, Alcohol Use - Never, Drug Use - No History, Caffeine Use - Rarely. 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 Vitals Time Taken: 3:06 PM, Height: 73 in, Weight: 374 lbs, BMI: 49.3, Temperature: 97.8 F, Pulse: 67 bpm, Respiratory Rate: 20 breaths/min, Blood Pressure: 142/81 mmHg, Capillary Blood Glucose: 116 mg/dl. Psychiatric pleasant and cooperative. General Notes: There is epithelialization to the previous wound site to the plantar aspect of right great toe Integumentary (Hair, Skin) Wound #1 status is Healed - Epithelialized. Original cause of wound was Trauma. The date acquired was: 12/27/2017. The wound has been in treatment 10 weeks. The wound is located on the [employer],Plantar T Great. The wound measures 0cm length x 0cm width x 0cm depth; 0cm^2 area and 0cm^3 volume. oe Wound #2 status is Healed - Epithelialized. Original cause of wound was Shear/Friction. The date acquired was: 10/06/2020. The wound has been in treatment 2 weeks. The wound is located on the Left,Anterior Lower Leg. The wound measures 0cm length x 0cm width x 0cm depth; 0cm^2 area and 0cm^3 volume. Assessment Active Problems ICD-10 Type 2 diabetes mellitus with foot ulcer Non-pressure chronic ulcer of other part of right foot with fat layer exposed Type 2 diabetes mellitus with diabetic neuropathy,  unspecified Lymphedema, not elsewhere classified Chronic combined systolic (congestive) and diastolic (congestive) heart failure Atherosclerotic heart disease of native coronary artery without angina pectoris Patient is right great toe is healed. No signs of infection. Recommended keeping the area moisturized and protected. I also suggested he offload this area with offloading pad. He can follow-up as needed. [facility] Edema Control - Lymphedema / SCD / Other: Elevate legs to the level of the heart or above for 30 minutes daily and/or when sitting, a frequency of: - whenever sitting Avoid standing for long periods of time. Patient to wear own compression stockings every day. Exercise regularly Moisturize legs daily. - both legs every night. Non Wound Condition: Protect area with: - callous cushion or padding to right great toe for next 2-3 weeks to protect 1. Discharge from our clinic due to closed wound 2. Follow-up as needed Electronic Signature(s) Signed: 10/20/2020 3:37:03 PM By: Geralyn CorwinHoffman, Yatzari Jonsson DO Entered By: Geralyn CorwinHoffman, Ofelia Podolski on 10/20/2020 15:36:16 -------------------------------------------------------------------------------- HxROS Details Patient Name: Date of Service: Ricky Lloyd, [name] 10/20/2020 3:45 PM Medical Record Number: [id number] Patient Account Number: [id number] Date of Birth/Sex: Treating RN: 04/04/1951 [age] y.o. Ricky SchoonerM) Boehlein, Linda Primary Care Provider: Cephus Richerurner, Miranda Other Clinician: Referring Provider: Treating Provider/Extender: Marrian SalvageHoffman, Aland Chestnutt Turner, [name] in Treatment: 10 Information Obtained From Patient Hematologic/Lymphatic Medical History: Positive for: Lymphedema - lower legs Respiratory Medical History: Positive for: Sleep  Apnea - CPAP Cardiovascular Medical History: Positive for: Arrhythmia - A-fib; Congestive Heart Failure; Coronary Artery Disease - s/p CABG w/5  stents; Hypertension Endocrine Medical History: Positive for: Type II Diabetes Time with diabetes: over 10 year Treated with: Insulin Blood sugar tested every day: Yes Tested : 2-3 times a day Musculoskeletal Medical History: Positive for: Osteoarthritis Negative for: Gout Neurologic Medical History: Positive for: Neuropathy Immunizations Pneumococcal Vaccine: Received Pneumococcal Vaccination: Yes Implantable Devices None Family and Social History Unknown History: Yes; Never smoker; Marital Status - Married; Alcohol Use: Never; Drug Use: No History; Caffeine Use: Rarely; Financial Concerns: No; Food, Clothing or Shelter Needs: No; Support System Lacking: No; Transportation Concerns: No Electronic Signature(s) Signed: 10/20/2020 3:37:03 PM By: Geralyn Corwin DO Signed: 10/20/2020 4:28:15 PM By: Zenaida Deed RN, BSN Entered By: Geralyn Corwin on 10/20/2020 15:33:37 -------------------------------------------------------------------------------- SuperBill Details Patient Name: Date of Service: Ricky Lloyd, [name] 10/20/2020 Medical Record Number: [id number] Patient Account Number: [id number] Date of Birth/Sex: Treating RN: 1951-04-21 (70 y.o. Ricky Lloyd Primary Care Provider: Cephus Richer Other Clinician: Referring Provider: Treating Provider/Extender: Marrian Salvage in Treatment: 10 Diagnosis Coding ICD-10 Codes Code Description E11.621 Type 2 diabetes mellitus with foot ulcer L97.512 Non-pressure chronic ulcer of other part of right foot with fat layer exposed E11.40 Type 2 diabetes mellitus with diabetic neuropathy, unspecified I89.0 Lymphedema, not elsewhere classified I50.42 Chronic combined systolic (congestive) and diastolic (congestive) heart failure I25.10 Atherosclerotic heart disease of native coronary artery without angina pectoris Facility Procedures CPT4 Code: [id number] Description: 99213 - WOUND CARE VISIT-LEV 3  EST PT Modifier: Quantity: 1 Physician Procedures : CPT4 Code Description Modifier [id number] - WC PHYS LEVEL 3 - EST PT ICD-10 Diagnosis Description E11.621 Type 2 diabetes mellitus with foot ulcer L97.512 Non-pressure chronic ulcer of other part of right foot with fat layer exposed E11.40 Type 2  diabetes mellitus with diabetic neuropathy, unspecified I89.0 Lymphedema, not elsewhere classified Quantity: 1 Electronic Signature(s) Signed: 10/20/2020 4:28:15 PM By: Zenaida Deed RN, BSN Signed: 10/27/2020 9:55:24 AM By: Geralyn Corwin DO Previous Signature: 10/20/2020 3:37:03 PM Version By: Geralyn Corwin DO Entered By: Zenaida Deed on 10/20/2020 15:40:41

## 2022-01-30 IMAGING — DX DG FOOT COMPLETE 3+V*R*
3 series · 3 of 3 positions shown · non-contrast
Comparison: None.

CLINICAL DATA: Diabetic patient with an ulceration on the great
toe.

EXAM:
RIGHT FOOT COMPLETE - 3+ VIEW

[foot ap]
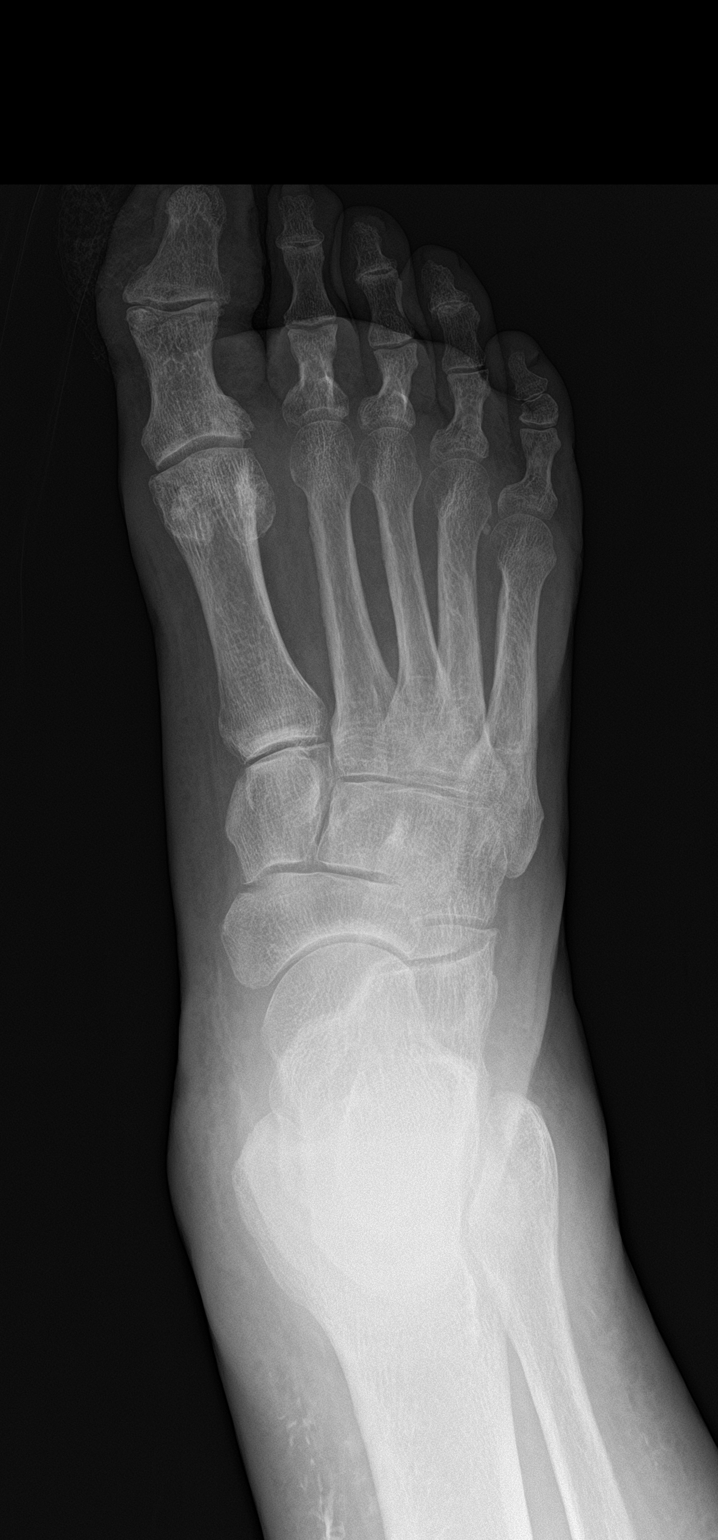

[foot obl]
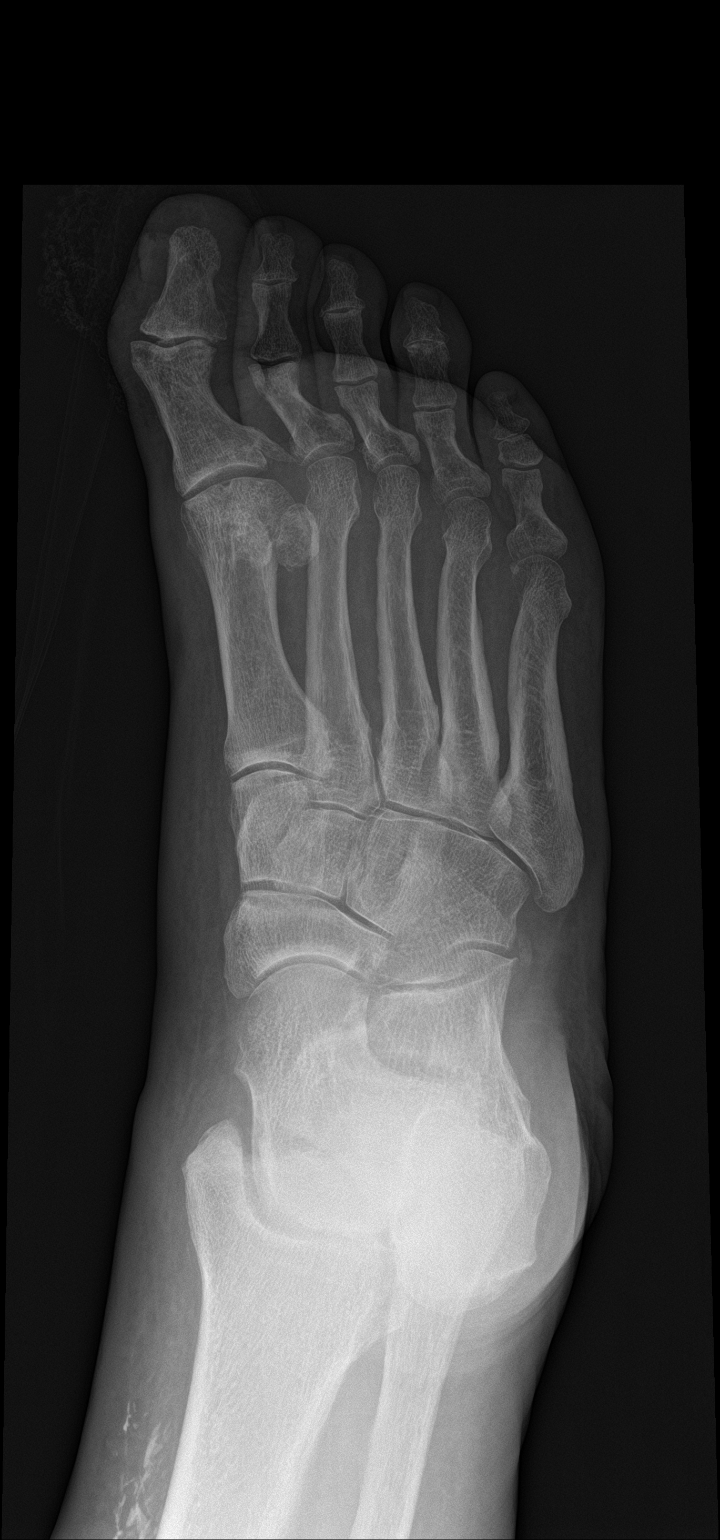

[foot lat]
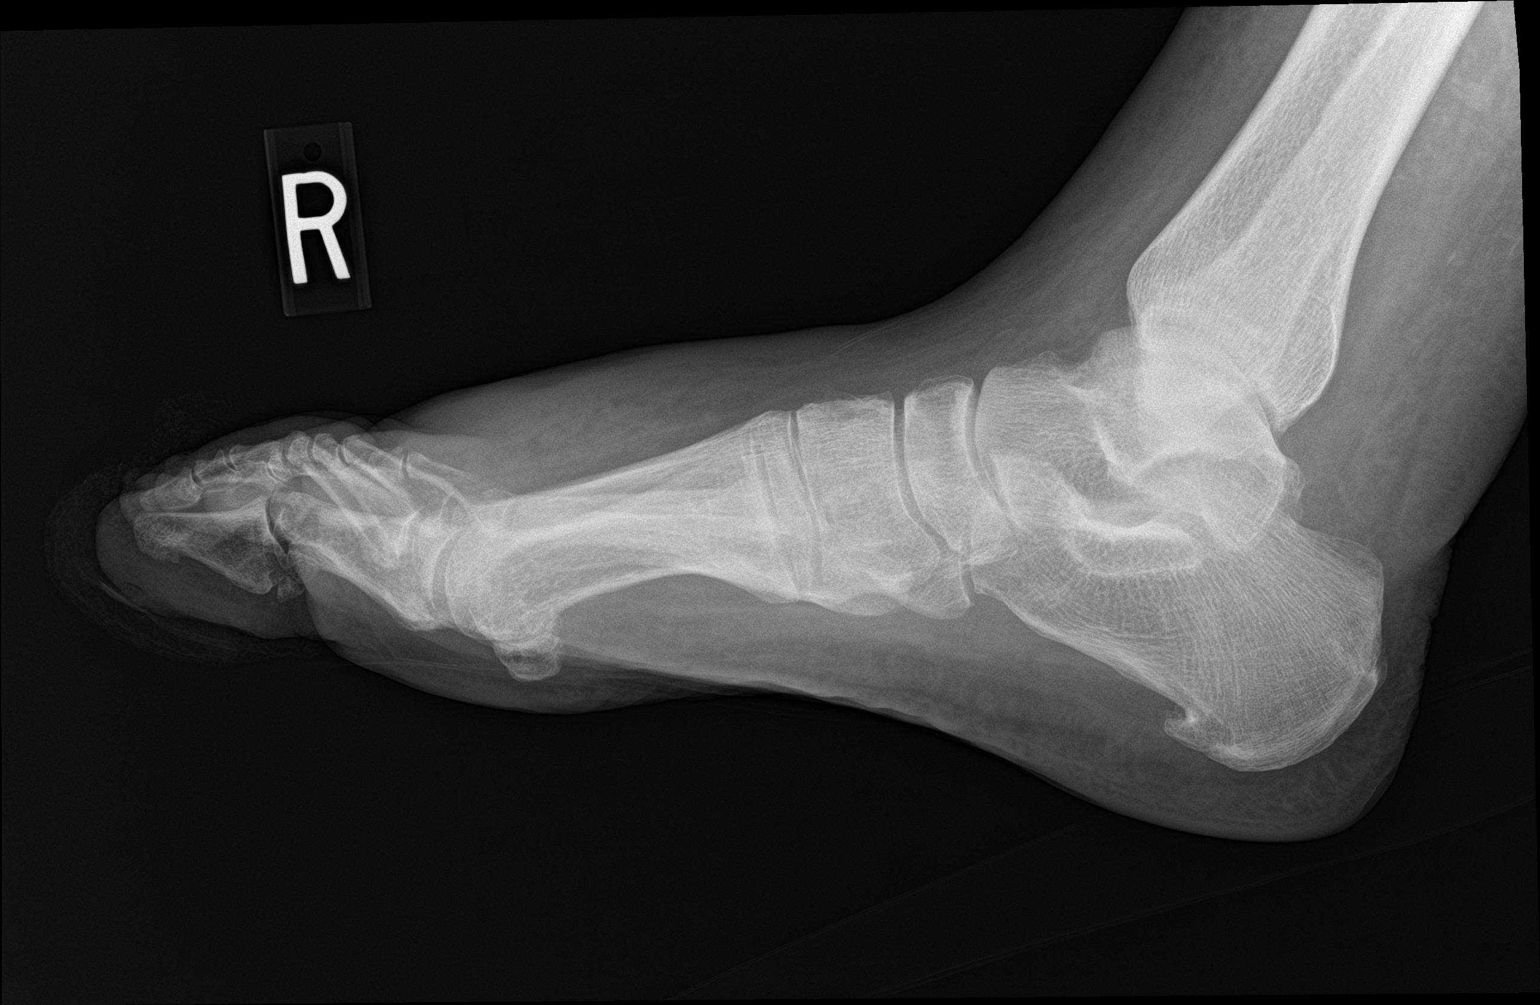

[3 of 3 positions shown; findings below may reference images not displayed]

FINDINGS: Bandaging is seen about the distal great toe. No underlying soft
tissue gas or radiopaque foreign body is identified. Soft tissues
over the dorsum of the foot are also swollen. No bony destructive
change or periosteal reaction. No fracture or dislocation. Plantar
calcaneal spur incidentally noted.
IMPRESSION: Skin ulceration and soft tissue swelling. Negative for plain film
evidence of osteomyelitis. No acute bony abnormality.

## 2023-05-31 ENCOUNTER — Other Ambulatory Visit (HOSPITAL_COMMUNITY): Payer: Self-pay

## 2023-06-01 ENCOUNTER — Other Ambulatory Visit (HOSPITAL_COMMUNITY): Payer: Self-pay

## 2023-06-05 ENCOUNTER — Other Ambulatory Visit (HOSPITAL_COMMUNITY): Payer: Self-pay

## 2023-06-06 ENCOUNTER — Other Ambulatory Visit (HOSPITAL_COMMUNITY): Payer: Self-pay

## 2023-06-06 ENCOUNTER — Other Ambulatory Visit: Payer: Self-pay

## 2023-06-06 MED ORDER — METOPROLOL TARTRATE 100 MG PO TABS
100.0000 mg | ORAL_TABLET | Freq: Two times a day (BID) | ORAL | 3 refills | Status: AC
  Filled 2023-06-06: qty 180, 90d supply, fill #0

## 2023-06-06 MED ORDER — POTASSIUM CHLORIDE CRYS ER 20 MEQ PO TBCR
20.0000 meq | EXTENDED_RELEASE_TABLET | Freq: Every day | ORAL | 3 refills | Status: DC
  Filled 2023-06-06 – 2023-09-24 (×2): qty 90, 90d supply, fill #0

## 2023-06-06 MED ORDER — NITROGLYCERIN 0.4 MG SL SUBL
0.4000 mg | SUBLINGUAL_TABLET | SUBLINGUAL | 3 refills | Status: DC | PRN
  Filled 2023-06-06: qty 25, 7d supply, fill #0

## 2023-06-06 MED ORDER — SILVER SULFADIAZINE 1 % EX CREA
1.0000 | TOPICAL_CREAM | Freq: Two times a day (BID) | CUTANEOUS | 3 refills | Status: DC
  Filled 2023-06-06: qty 1200, 90d supply, fill #0

## 2023-06-06 MED ORDER — LISINOPRIL 20 MG PO TABS
20.0000 mg | ORAL_TABLET | Freq: Every day | ORAL | 3 refills | Status: AC
  Filled 2023-06-06 – 2023-09-24 (×2): qty 90, 90d supply, fill #0

## 2023-06-06 MED ORDER — TORSEMIDE 20 MG PO TABS
100.0000 mg | ORAL_TABLET | Freq: Every day | ORAL | 2 refills | Status: AC
  Filled 2023-06-06 – 2023-09-24 (×2): qty 450, 90d supply, fill #0

## 2023-06-06 MED ORDER — INSULIN PEN NEEDLE 31G X 8 MM MISC
4 refills | Status: AC
  Filled 2023-06-06: qty 500, 100d supply, fill #0

## 2023-06-07 ENCOUNTER — Other Ambulatory Visit (HOSPITAL_COMMUNITY): Payer: Self-pay

## 2023-06-07 ENCOUNTER — Other Ambulatory Visit: Payer: Self-pay

## 2023-06-08 ENCOUNTER — Other Ambulatory Visit: Payer: Self-pay

## 2023-06-12 ENCOUNTER — Other Ambulatory Visit: Payer: Self-pay

## 2023-07-12 ENCOUNTER — Other Ambulatory Visit (HOSPITAL_BASED_OUTPATIENT_CLINIC_OR_DEPARTMENT_OTHER): Payer: Self-pay

## 2023-09-24 ENCOUNTER — Other Ambulatory Visit (HOSPITAL_COMMUNITY): Payer: Self-pay

## 2023-09-24 ENCOUNTER — Other Ambulatory Visit: Payer: Self-pay

## 2023-09-25 ENCOUNTER — Other Ambulatory Visit: Payer: Self-pay

## 2023-09-25 ENCOUNTER — Encounter: Payer: Self-pay | Admitting: Pharmacist

## 2023-09-28 ENCOUNTER — Other Ambulatory Visit: Payer: Self-pay

## 2023-11-29 ENCOUNTER — Other Ambulatory Visit (HOSPITAL_COMMUNITY): Payer: Self-pay

## 2023-11-29 MED ORDER — AMIODARONE HCL 400 MG PO TABS
ORAL_TABLET | ORAL | 0 refills | Status: DC
  Filled 2023-11-29: qty 107, 100d supply, fill #0

## 2024-01-02 ENCOUNTER — Other Ambulatory Visit (HOSPITAL_BASED_OUTPATIENT_CLINIC_OR_DEPARTMENT_OTHER): Payer: Self-pay

## 2024-01-02 MED ORDER — RESTORE SILVER DRESSING 2"X2" EX PADS
MEDICATED_PAD | CUTANEOUS | 1 refills | Status: AC
  Filled 2024-02-19 – 2024-05-08 (×4): qty 30, fill #0

## 2024-01-03 ENCOUNTER — Other Ambulatory Visit (HOSPITAL_BASED_OUTPATIENT_CLINIC_OR_DEPARTMENT_OTHER): Payer: Self-pay

## 2024-01-03 ENCOUNTER — Other Ambulatory Visit (HOSPITAL_COMMUNITY): Payer: Self-pay

## 2024-01-03 ENCOUNTER — Other Ambulatory Visit: Payer: Self-pay

## 2024-01-03 MED ORDER — LEVOTHYROXINE SODIUM 137 MCG PO TABS
137.0000 ug | ORAL_TABLET | Freq: Every morning | ORAL | 3 refills | Status: AC
  Filled 2024-01-03: qty 90, 90d supply, fill #0
  Filled 2024-03-27: qty 90, 90d supply, fill #1

## 2024-02-08 ENCOUNTER — Other Ambulatory Visit: Payer: Self-pay

## 2024-02-08 ENCOUNTER — Encounter: Payer: Self-pay | Admitting: Pharmacist

## 2024-02-08 ENCOUNTER — Other Ambulatory Visit (HOSPITAL_COMMUNITY): Payer: Self-pay

## 2024-02-08 MED ORDER — LISINOPRIL 20 MG PO TABS
10.0000 mg | ORAL_TABLET | Freq: Every day | ORAL | 1 refills | Status: DC
  Filled 2024-02-08 – 2024-02-18 (×2): qty 45, 90d supply, fill #0
  Filled 2024-05-07: qty 45, 90d supply, fill #1

## 2024-02-08 MED ORDER — TIZANIDINE HCL 2 MG PO TABS
2.0000 mg | ORAL_TABLET | Freq: Four times a day (QID) | ORAL | 3 refills | Status: AC | PRN
  Filled 2024-02-08 – 2024-02-18 (×2): qty 30, 8d supply, fill #0

## 2024-02-11 ENCOUNTER — Other Ambulatory Visit (HOSPITAL_COMMUNITY): Payer: Self-pay

## 2024-02-11 MED ORDER — SPIRONOLACTONE 25 MG PO TABS
25.0000 mg | ORAL_TABLET | Freq: Every day | ORAL | 3 refills | Status: AC
  Filled 2024-02-11: qty 90, 90d supply, fill #0
  Filled 2024-05-04: qty 90, 90d supply, fill #1

## 2024-02-13 ENCOUNTER — Other Ambulatory Visit: Payer: Self-pay

## 2024-02-13 ENCOUNTER — Other Ambulatory Visit (HOSPITAL_COMMUNITY): Payer: Self-pay

## 2024-02-14 ENCOUNTER — Other Ambulatory Visit (HOSPITAL_COMMUNITY): Payer: Self-pay

## 2024-02-15 ENCOUNTER — Other Ambulatory Visit (HOSPITAL_COMMUNITY): Payer: Self-pay

## 2024-02-18 ENCOUNTER — Other Ambulatory Visit: Payer: Self-pay

## 2024-02-18 ENCOUNTER — Other Ambulatory Visit (HOSPITAL_COMMUNITY): Payer: Self-pay

## 2024-02-19 ENCOUNTER — Other Ambulatory Visit (HOSPITAL_COMMUNITY): Payer: Self-pay

## 2024-02-19 ENCOUNTER — Encounter: Payer: Self-pay | Admitting: Pharmacist

## 2024-02-19 ENCOUNTER — Other Ambulatory Visit: Payer: Self-pay

## 2024-02-19 MED ORDER — TIZANIDINE HCL 2 MG PO TABS
2.0000 mg | ORAL_TABLET | Freq: Four times a day (QID) | ORAL | 1 refills | Status: DC | PRN
  Filled 2024-02-19: qty 90, 23d supply, fill #0
  Filled 2024-05-08: qty 90, 23d supply, fill #1

## 2024-02-19 MED ORDER — TORSEMIDE 20 MG PO TABS
80.0000 mg | ORAL_TABLET | Freq: Two times a day (BID) | ORAL | 3 refills | Status: AC
  Filled 2024-02-19: qty 720, 90d supply, fill #0
  Filled 2024-05-07: qty 720, 90d supply, fill #1

## 2024-02-20 ENCOUNTER — Other Ambulatory Visit (HOSPITAL_COMMUNITY): Payer: Self-pay

## 2024-02-21 ENCOUNTER — Other Ambulatory Visit (HOSPITAL_COMMUNITY): Payer: Self-pay

## 2024-02-22 ENCOUNTER — Other Ambulatory Visit (HOSPITAL_COMMUNITY): Payer: Self-pay

## 2024-03-27 ENCOUNTER — Other Ambulatory Visit (HOSPITAL_COMMUNITY): Payer: Self-pay

## 2024-03-27 MED ORDER — CEPHALEXIN 500 MG PO CAPS
500.0000 mg | ORAL_CAPSULE | Freq: Three times a day (TID) | ORAL | 0 refills | Status: AC
  Filled 2024-03-27: qty 9, 3d supply, fill #0

## 2024-03-27 MED ORDER — METOPROLOL SUCCINATE ER 100 MG PO TB24
100.0000 mg | ORAL_TABLET | Freq: Every day | ORAL | 1 refills | Status: AC
  Filled 2024-03-27 – 2024-04-08 (×3): qty 90, 90d supply, fill #0
  Filled 2024-05-08: qty 90, 90d supply, fill #1

## 2024-03-28 ENCOUNTER — Other Ambulatory Visit (HOSPITAL_COMMUNITY): Payer: Self-pay

## 2024-04-07 ENCOUNTER — Other Ambulatory Visit (HOSPITAL_COMMUNITY): Payer: Self-pay

## 2024-04-08 ENCOUNTER — Other Ambulatory Visit: Payer: Self-pay

## 2024-04-08 ENCOUNTER — Other Ambulatory Visit (HOSPITAL_COMMUNITY): Payer: Self-pay

## 2024-05-07 ENCOUNTER — Other Ambulatory Visit: Payer: Self-pay

## 2024-05-07 ENCOUNTER — Other Ambulatory Visit (HOSPITAL_COMMUNITY): Payer: Self-pay

## 2024-05-08 ENCOUNTER — Other Ambulatory Visit: Payer: Self-pay

## 2024-05-08 ENCOUNTER — Other Ambulatory Visit (HOSPITAL_BASED_OUTPATIENT_CLINIC_OR_DEPARTMENT_OTHER): Payer: Self-pay

## 2024-05-08 ENCOUNTER — Other Ambulatory Visit (HOSPITAL_COMMUNITY): Payer: Self-pay

## 2024-05-08 MED ORDER — POTASSIUM CHLORIDE CRYS ER 20 MEQ PO TBCR
20.0000 meq | EXTENDED_RELEASE_TABLET | Freq: Every day | ORAL | 3 refills | Status: AC
  Filled 2024-05-08: qty 90, 90d supply, fill #0
  Filled 2024-05-08: qty 90, fill #0

## 2024-05-08 MED ORDER — NITROGLYCERIN 0.4 MG SL SUBL
0.4000 mg | SUBLINGUAL_TABLET | SUBLINGUAL | 3 refills | Status: AC | PRN
  Filled 2024-05-08: qty 25, 9d supply, fill #0

## 2024-05-08 MED ORDER — SILVER SULFADIAZINE 1 % EX CREA
1.0000 | TOPICAL_CREAM | CUTANEOUS | 3 refills | Status: AC
  Filled 2024-05-08: qty 400, 90d supply, fill #0

## 2024-05-09 ENCOUNTER — Other Ambulatory Visit: Payer: Self-pay

## 2024-05-09 ENCOUNTER — Other Ambulatory Visit (HOSPITAL_COMMUNITY): Payer: Self-pay

## 2024-05-12 ENCOUNTER — Other Ambulatory Visit: Payer: Self-pay

## 2024-06-05 ENCOUNTER — Other Ambulatory Visit (HOSPITAL_COMMUNITY): Payer: Self-pay

## 2024-07-02 ENCOUNTER — Other Ambulatory Visit: Payer: Self-pay
